# Patient Record
Sex: Female | Born: 1940 | Race: White | Hispanic: No | Marital: Single | Smoking: Former smoker
Health system: Southern US, Community
[De-identification: ages and names within clinical notes are randomized; demographics above are authoritative.]

## PROBLEM LIST (undated history)

## (undated) DIAGNOSIS — C539 Malignant neoplasm of cervix uteri, unspecified: Secondary | ICD-10-CM

## (undated) DIAGNOSIS — N189 Chronic kidney disease, unspecified: Secondary | ICD-10-CM

## (undated) DIAGNOSIS — I739 Peripheral vascular disease, unspecified: Secondary | ICD-10-CM

## (undated) DIAGNOSIS — C50919 Malignant neoplasm of unspecified site of unspecified female breast: Secondary | ICD-10-CM

## (undated) DIAGNOSIS — I10 Essential (primary) hypertension: Secondary | ICD-10-CM

## (undated) DIAGNOSIS — I701 Atherosclerosis of renal artery: Secondary | ICD-10-CM

## (undated) HISTORY — PX: REPAIR OF PERFORATED ULCER: SHX6065

## (undated) HISTORY — PX: EYE SURGERY: SHX253

## (undated) HISTORY — PX: THROMBOENDARTERECTOMY: SHX46

## (undated) HISTORY — PX: RENAL ARTERY BYPASS: SHX2318

---

## 2012-01-12 DIAGNOSIS — H353190 Nonexudative age-related macular degeneration, unspecified eye, stage unspecified: Secondary | ICD-10-CM | POA: Insufficient documentation

## 2012-01-12 DIAGNOSIS — I779 Disorder of arteries and arterioles, unspecified: Secondary | ICD-10-CM | POA: Insufficient documentation

## 2012-01-12 DIAGNOSIS — H348192 Central retinal vein occlusion, unspecified eye, stable: Secondary | ICD-10-CM | POA: Insufficient documentation

## 2012-01-12 DIAGNOSIS — H25819 Combined forms of age-related cataract, unspecified eye: Secondary | ICD-10-CM | POA: Insufficient documentation

## 2012-01-12 DIAGNOSIS — I10 Essential (primary) hypertension: Secondary | ICD-10-CM | POA: Insufficient documentation

## 2012-01-12 DIAGNOSIS — H401190 Primary open-angle glaucoma, unspecified eye, stage unspecified: Secondary | ICD-10-CM | POA: Insufficient documentation

## 2012-03-13 DIAGNOSIS — H269 Unspecified cataract: Secondary | ICD-10-CM | POA: Insufficient documentation

## 2012-07-03 DIAGNOSIS — Z87891 Personal history of nicotine dependence: Secondary | ICD-10-CM | POA: Insufficient documentation

## 2012-07-03 DIAGNOSIS — N289 Disorder of kidney and ureter, unspecified: Secondary | ICD-10-CM | POA: Insufficient documentation

## 2012-09-25 DIAGNOSIS — H35359 Cystoid macular degeneration, unspecified eye: Secondary | ICD-10-CM | POA: Insufficient documentation

## 2012-11-11 ENCOUNTER — Inpatient Hospital Stay (HOSPITAL_COMMUNITY)
Admission: EM | Admit: 2012-11-11 | Discharge: 2012-11-15 | DRG: 069 | Disposition: A | Discharge status: Home / Self Care | Payer: Medicare Other | Attending: Neurology | Admitting: Neurology

## 2012-11-11 ENCOUNTER — Encounter (HOSPITAL_COMMUNITY): Payer: Self-pay | Admitting: Emergency Medicine

## 2012-11-11 ENCOUNTER — Inpatient Hospital Stay (HOSPITAL_COMMUNITY): Payer: Medicare Other

## 2012-11-11 ENCOUNTER — Emergency Department (HOSPITAL_COMMUNITY): Payer: Medicare Other

## 2012-11-11 DIAGNOSIS — Z79899 Other long term (current) drug therapy: Secondary | ICD-10-CM

## 2012-11-11 DIAGNOSIS — Z853 Personal history of malignant neoplasm of breast: Secondary | ICD-10-CM

## 2012-11-11 DIAGNOSIS — N189 Chronic kidney disease, unspecified: Secondary | ICD-10-CM | POA: Diagnosis present

## 2012-11-11 DIAGNOSIS — G458 Other transient cerebral ischemic attacks and related syndromes: Principal | ICD-10-CM | POA: Diagnosis present

## 2012-11-11 DIAGNOSIS — I619 Nontraumatic intracerebral hemorrhage, unspecified: Secondary | ICD-10-CM

## 2012-11-11 DIAGNOSIS — I129 Hypertensive chronic kidney disease with stage 1 through stage 4 chronic kidney disease, or unspecified chronic kidney disease: Secondary | ICD-10-CM | POA: Diagnosis present

## 2012-11-11 DIAGNOSIS — I10 Essential (primary) hypertension: Secondary | ICD-10-CM

## 2012-11-11 DIAGNOSIS — E860 Dehydration: Secondary | ICD-10-CM | POA: Diagnosis present

## 2012-11-11 DIAGNOSIS — Z87891 Personal history of nicotine dependence: Secondary | ICD-10-CM

## 2012-11-11 DIAGNOSIS — Z8541 Personal history of malignant neoplasm of cervix uteri: Secondary | ICD-10-CM

## 2012-11-11 DIAGNOSIS — I480 Paroxysmal atrial fibrillation: Secondary | ICD-10-CM

## 2012-11-11 DIAGNOSIS — I779 Disorder of arteries and arterioles, unspecified: Secondary | ICD-10-CM

## 2012-11-11 DIAGNOSIS — Z7901 Long term (current) use of anticoagulants: Secondary | ICD-10-CM

## 2012-11-11 DIAGNOSIS — I639 Cerebral infarction, unspecified: Secondary | ICD-10-CM

## 2012-11-11 DIAGNOSIS — E785 Hyperlipidemia, unspecified: Secondary | ICD-10-CM

## 2012-11-11 DIAGNOSIS — I7771 Dissection of carotid artery: Secondary | ICD-10-CM | POA: Diagnosis present

## 2012-11-11 DIAGNOSIS — I6381 Other cerebral infarction due to occlusion or stenosis of small artery: Secondary | ICD-10-CM

## 2012-11-11 DIAGNOSIS — R29898 Other symptoms and signs involving the musculoskeletal system: Secondary | ICD-10-CM | POA: Diagnosis present

## 2012-11-11 DIAGNOSIS — N179 Acute kidney failure, unspecified: Secondary | ICD-10-CM | POA: Diagnosis present

## 2012-11-11 DIAGNOSIS — R55 Syncope and collapse: Secondary | ICD-10-CM

## 2012-11-11 DIAGNOSIS — I4891 Unspecified atrial fibrillation: Secondary | ICD-10-CM

## 2012-11-11 DIAGNOSIS — I739 Peripheral vascular disease, unspecified: Secondary | ICD-10-CM

## 2012-11-11 DIAGNOSIS — R4701 Aphasia: Secondary | ICD-10-CM | POA: Diagnosis present

## 2012-11-11 DIAGNOSIS — I614 Nontraumatic intracerebral hemorrhage in cerebellum: Secondary | ICD-10-CM

## 2012-11-11 HISTORY — DX: Chronic kidney disease, unspecified: N18.9

## 2012-11-11 HISTORY — DX: Peripheral vascular disease, unspecified: I73.9

## 2012-11-11 HISTORY — DX: Atherosclerosis of renal artery: I70.1

## 2012-11-11 HISTORY — DX: Malignant neoplasm of cervix uteri, unspecified: C53.9

## 2012-11-11 HISTORY — DX: Malignant neoplasm of unspecified site of unspecified female breast: C50.919

## 2012-11-11 HISTORY — DX: Essential (primary) hypertension: I10

## 2012-11-11 LAB — DIFFERENTIAL
Basophils Absolute: 0.1 10*3/uL (ref 0.0–0.1)
Basophils Relative: 1 % (ref 0–1)
Eosinophils Absolute: 0.2 10*3/uL (ref 0.0–0.7)
Lymphocytes Relative: 24 % (ref 12–46)
Monocytes Absolute: 0.7 10*3/uL (ref 0.1–1.0)
Neutro Abs: 3.9 10*3/uL (ref 1.7–7.7)

## 2012-11-11 LAB — CBC
HCT: 30.7 % — ABNORMAL LOW (ref 36.0–46.0)
MCV: 90 fL (ref 78.0–100.0)
Platelets: 177 10*3/uL (ref 150–400)
RBC: 3.41 MIL/uL — ABNORMAL LOW (ref 3.87–5.11)
WBC: 6.5 10*3/uL (ref 4.0–10.5)

## 2012-11-11 LAB — URINALYSIS, ROUTINE W REFLEX MICROSCOPIC
Bilirubin Urine: NEGATIVE
Hgb urine dipstick: NEGATIVE
Ketones, ur: NEGATIVE mg/dL
Nitrite: NEGATIVE
Protein, ur: 100 mg/dL — AB
Specific Gravity, Urine: 1.013 (ref 1.005–1.030)
Urobilinogen, UA: 0.2 mg/dL (ref 0.0–1.0)

## 2012-11-11 LAB — LIPID PANEL
LDL Cholesterol: 134 mg/dL — ABNORMAL HIGH (ref 0–99)
Triglycerides: 154 mg/dL — ABNORMAL HIGH (ref <150)
VLDL: 31 mg/dL (ref 0–40)

## 2012-11-11 LAB — COMPREHENSIVE METABOLIC PANEL
Albumin: 3.6 g/dL (ref 3.5–5.2)
BUN: 42 mg/dL — ABNORMAL HIGH (ref 6–23)
Calcium: 9 mg/dL (ref 8.4–10.5)
GFR calc Af Amer: 21 mL/min — ABNORMAL LOW (ref >90)
Glucose, Bld: 132 mg/dL — ABNORMAL HIGH (ref 70–99)
Sodium: 139 mEq/L (ref 135–145)
Total Protein: 6.8 g/dL (ref 6.0–8.3)

## 2012-11-11 LAB — URINE MICROSCOPIC-ADD ON

## 2012-11-11 LAB — TROPONIN I: Troponin I: <0.3 ng/mL (ref <0.30)

## 2012-11-11 LAB — POCT I-STAT TROPONIN I: Troponin i, poc: 0.09 ng/mL (ref 0.00–0.08)

## 2012-11-11 LAB — GLUCOSE, CAPILLARY: Glucose-Capillary: 124 mg/dL — ABNORMAL HIGH (ref 70–99)

## 2012-11-11 LAB — PROTIME-INR: Prothrombin Time: 14.1 seconds (ref 11.6–15.2)

## 2012-11-11 LAB — BASIC METABOLIC PANEL
BUN: 38 mg/dL — ABNORMAL HIGH (ref 6–23)
CO2: 20 mEq/L (ref 19–32)
Calcium: 8.9 mg/dL (ref 8.4–10.5)
Creatinine, Ser: 2.32 mg/dL — ABNORMAL HIGH (ref 0.50–1.10)
GFR calc non Af Amer: 20 mL/min — ABNORMAL LOW (ref >90)
Glucose, Bld: 116 mg/dL — ABNORMAL HIGH (ref 70–99)
Sodium: 139 mEq/L (ref 135–145)

## 2012-11-11 MED ORDER — TIMOLOL MALEATE 0.25 % OP SOLN
1.0000 [drp] | Freq: Two times a day (BID) | OPHTHALMIC | Status: DC
  Administered 2012-11-11 – 2012-11-15 (×9): 1 [drp] via OPHTHALMIC
  Filled 2012-11-11: qty 5

## 2012-11-11 MED ORDER — PREDNISOLONE ACETATE 1 % OP SUSP
1.0000 [drp] | Freq: Every day | OPHTHALMIC | Status: DC
  Administered 2012-11-11 – 2012-11-15 (×5): 1 [drp] via OPHTHALMIC
  Filled 2012-11-11: qty 1

## 2012-11-11 MED ORDER — ONDANSETRON HCL 4 MG/2ML IJ SOLN: 4.0000 mg | Freq: Four times a day (QID) | INTRAMUSCULAR | Status: DC | PRN

## 2012-11-11 MED ORDER — LABETALOL HCL 200 MG PO TABS
200.0000 mg | ORAL_TABLET | Freq: Two times a day (BID) | ORAL | Status: DC
  Administered 2012-11-11 – 2012-11-15 (×9): 200 mg via ORAL
  Filled 2012-11-11 (×10): qty 1

## 2012-11-11 MED ORDER — SENNOSIDES-DOCUSATE SODIUM 8.6-50 MG PO TABS
1.0000 | ORAL_TABLET | Freq: Two times a day (BID) | ORAL | Status: DC
  Administered 2012-11-11 – 2012-11-15 (×9): 1 via ORAL
  Filled 2012-11-11 (×9): qty 1

## 2012-11-11 MED ORDER — ACETAMINOPHEN 325 MG PO TABS
650.0000 mg | ORAL_TABLET | ORAL | Status: DC | PRN
  Administered 2012-11-11 – 2012-11-15 (×8): 650 mg via ORAL
  Filled 2012-11-11 (×8): qty 2

## 2012-11-11 MED ORDER — LABETALOL HCL 5 MG/ML IV SOLN
10.0000 mg | INTRAVENOUS | Status: DC | PRN
  Administered 2012-11-15: 20 mg via INTRAVENOUS
  Filled 2012-11-11 (×2): qty 4

## 2012-11-11 MED ORDER — KETOROLAC TROMETHAMINE 0.5 % OP SOLN
1.0000 [drp] | Freq: Every day | OPHTHALMIC | Status: DC
  Administered 2012-11-11 – 2012-11-15 (×5): 1 [drp] via OPHTHALMIC
  Filled 2012-11-11: qty 3

## 2012-11-11 MED ORDER — ALTEPLASE (STROKE) FULL DOSE INFUSION
0.9000 mg/kg | Freq: Once | INTRAVENOUS | Status: DC
  Filled 2012-11-11: qty 45

## 2012-11-11 MED ORDER — SALINE SPRAY 0.65 % NA SOLN
1.0000 | NASAL | Status: DC | PRN
  Administered 2012-11-11: 1 via NASAL
  Filled 2012-11-11: qty 44

## 2012-11-11 MED ORDER — CALCIUM CARBONATE ANTACID 500 MG PO CHEW
1.0000 | CHEWABLE_TABLET | Freq: Once | ORAL | Status: AC
  Administered 2012-11-11: 200 mg via ORAL
  Filled 2012-11-11: qty 1

## 2012-11-11 MED ORDER — OCUVITE-LUTEIN PO CAPS
1.0000 | ORAL_CAPSULE | Freq: Every day | ORAL | Status: DC
  Administered 2012-11-11 – 2012-11-15 (×4): 1 via ORAL
  Filled 2012-11-11 (×5): qty 1

## 2012-11-11 MED ORDER — ACETAMINOPHEN 650 MG RE SUPP: 650.0000 mg | RECTAL | Status: DC | PRN

## 2012-11-11 MED ORDER — LABETALOL HCL 5 MG/ML IV SOLN
5.0000 mg | Freq: Once | INTRAVENOUS | Status: AC
  Administered 2012-11-11: 5 mg via INTRAVENOUS

## 2012-11-11 MED ORDER — SODIUM CHLORIDE 0.9 % IV SOLN
INTRAVENOUS | Status: DC
  Administered 2012-11-11: 20:00:00 via INTRAVENOUS
  Administered 2012-11-11: 1000 mL via INTRAVENOUS
  Administered 2012-11-12 – 2012-11-15 (×4): via INTRAVENOUS

## 2012-11-11 MED ORDER — CLORAZEPATE DIPOTASSIUM 7.5 MG PO TABS: 7.5000 mg | ORAL_TABLET | Freq: Three times a day (TID) | ORAL | Status: DC

## 2012-11-11 MED ORDER — LABETALOL HCL 5 MG/ML IV SOLN
10.0000 mg | Freq: Once | INTRAVENOUS | Status: AC
  Administered 2012-11-11: 10 mg via INTRAVENOUS

## 2012-11-11 MED ORDER — PANTOPRAZOLE SODIUM 40 MG PO TBEC
40.0000 mg | DELAYED_RELEASE_TABLET | Freq: Every day | ORAL | Status: DC
  Administered 2012-11-11 – 2012-11-15 (×5): 40 mg via ORAL
  Filled 2012-11-11 (×5): qty 1

## 2012-11-11 MED ORDER — PANTOPRAZOLE SODIUM 40 MG IV SOLR
40.0000 mg | Freq: Every day | INTRAVENOUS | Status: DC
  Administered 2012-11-11: 40 mg via INTRAVENOUS
  Filled 2012-11-11 (×2): qty 40

## 2012-11-11 MED ORDER — LABETALOL HCL 5 MG/ML IV SOLN
5.0000 mg | Freq: Once | INTRAVENOUS | Status: AC
  Administered 2012-11-11: 5 mg via INTRAVENOUS
  Filled 2012-11-11: qty 4

## 2012-11-11 MED ORDER — NICARDIPINE HCL IN NACL 20-0.86 MG/200ML-% IV SOLN
5.0000 mg/h | INTRAVENOUS | Status: DC
  Administered 2012-11-11: 3 mg/h via INTRAVENOUS
  Administered 2012-11-11 (×2): 5 mg/h via INTRAVENOUS
  Administered 2012-11-11: 2 mg/h via INTRAVENOUS
  Administered 2012-11-12 (×2): 5 mg/h via INTRAVENOUS
  Administered 2012-11-12: 2.5 mg/h via INTRAVENOUS
  Administered 2012-11-12: 5 mg/h via INTRAVENOUS
  Administered 2012-11-13: 2.5 mg/h via INTRAVENOUS
  Filled 2012-11-11 (×9): qty 200

## 2012-11-11 MED ORDER — CALCIUM CARBONATE ANTACID 500 MG PO CHEW
1.0000 | CHEWABLE_TABLET | Freq: Two times a day (BID) | ORAL | Status: DC | PRN
  Filled 2012-11-11: qty 1

## 2012-11-11 MED ORDER — CLORAZEPATE DIPOTASSIUM 3.75 MG PO TABS
7.5000 mg | ORAL_TABLET | Freq: Three times a day (TID) | ORAL | Status: DC
  Administered 2012-11-11 – 2012-11-12 (×4): 7.5 mg via ORAL
  Filled 2012-11-11: qty 1
  Filled 2012-11-11: qty 2
  Filled 2012-11-11 (×3): qty 1
  Filled 2012-11-11 (×2): qty 2

## 2012-11-11 NOTE — Consult Note (Signed)
CARDIOLOGY CONSULT NOTE      Primary Care Physician: none Referring Physician:  Dr Pearlean Brownie  Admit Date: 11/11/2012  Reason for consultation:  Atrial  fibrillation  Dana Carr is a 72 y.o. female with a h/o significant peripheral vascular disease, poorly controlled hypertension, and atrial fibrillation who is admitted with an intercranial event.  She reports that her blood pressure has been very difficult to control recently.  She has also noted that several times when she checks her blood pressure cuff that it reads "afib".  She has occasional palpitations and heart beat irregularity but is mostly asymptomatic with afib. She is admitted after an event which occurred yesterday eventing.  She reports that around 11:15 PM that she walked into the kitchen, called out her daughter's name and then became unresponsive. When EMS arrival, she was moving her left side but not her right.  She was awake but nonverbal.  She has had gradual return of her neurologic function.  She was documented to have afib with elevated V rate (120s) by EMS, though she has been in sinus rhythm here.  CT of the brain reveals a small thalamic hemorrhage.  MRI is pending.  Today, she denies symptoms of chest pain, shortness of breath, orthopnea, PND, lower extremity edema, or dizziness. The patient is tolerating medications without difficulties and is otherwise without complaint today.   Past Medical History  Diagnosis Date  . Hypertension   . Cervical cancer   . Breast cancer   . Peripheral vascular disease   . Renal artery stenosis   . Chronic renal insufficiency    Past Surgical History  Procedure Laterality Date  . Repair of perforated ulcer N/A     ulceration * 2 of the esophagus  . Thromboendarterectomy    . 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 Eye surgery Right cataract repair    . clorazepate  7.5 mg Oral TID  . ketorolac  1 drop Right Eye Daily  . labetalol  200 mg Oral BID  .  pantoprazole  40 mg Oral Q1200  . prednisoLONE acetate  1 drop Right Eye Daily  . senna-docusate  1 tablet Oral BID  . timolol  1 drop Both Eyes BID   . sodium chloride 50 mL/hr at 11/11/12 1049  . niCARDipine 4 mg/hr (11/11/12 1137)    Allergies  Allergen Reactions  . Haldol (Haloperidol) Other (See Comments)    unknown  . Tape Other (See Comments)    Skin irritation  . Vasotec (Enalapril) Other (See Comments)    Throat irritation-lost voice    History   Social History  . Marital Status: Single    Spouse Name: N/A    Number of Children: N/A  . Years of Education: N/A   Occupational History  . Not on file.   Social History Main Topics  . Smoking status: Former Smoker -- 0.25 packs/day for 40 years    Types: Cigarettes    Quit date: 12/31/2011  . Smokeless tobacco: Never Used  . Alcohol Use: No  . Drug Use: No  . Sexually Active: Not Currently   Other Topics Concern  . Not on file   Social History Narrative   Lives in [place]    Family History  Problem Relation Age of Onset  . Atrial fibrillation Brother     ROS- All systems are reviewed and negative except as per the HPI above  Physical Exam: Telemetry: Filed Vitals:   11/11/12  1145 11/11/12 1200 11/11/12 1215 11/11/12 1230  BP: 158/82 137/99 141/74 125/79  Pulse: 90 90 85 84  Temp:   99.3 F (37.4 C)   TempSrc:   Oral   Resp: 20 19 20 22   Height:      Weight:      SpO2: 98% 98% 97% 98%    GEN- The patient is well appearing, alert and oriented x 3 today.   Head- normocephalic, atraumatic Eyes-  Sclera clear, conjunctiva pink Ears- hearing intact Oropharynx- clear Neck- supple, no JVP Lymph- no cervical lymphadenopathy Lungs- Clear to ausculation bilaterally, normal work of breathing Heart- Regular rate and rhythm, no murmurs, rubs or gallops, PMI not laterally displaced GI- soft, NT, ND, + BS Extremities- no clubbing, cyanosis, or edema MS- no significant deformity or  atrophy Skin- no rash or lesion Psych- euthymic mood, full affect Neuro- strength and sensation are intact  EKG: strip from ems reveals afib EKG 11/11/12- sinus rhythm, LAA, nonspecific ST/T changes\ Echo pending  Labs:   Lab Results  Component Value Date   WBC 6.5 11/11/2012   HGB 10.5* 11/11/2012   HCT 30.7* 11/11/2012   MCV 90.0 11/11/2012   PLT 177 11/11/2012    Recent Labs Lab 11/11/12 0047 11/11/12 0631  NA 139 139  K 4.3 4.0  CL 107 108  CO2 19 20  BUN 42* 38*  CREATININE 2.58* 2.32*  CALCIUM 9.0 8.9  PROT 6.8  --   BILITOT 0.4  --   ALKPHOS 75  --   ALT 9  --   AST 17  --   GLUCOSE 132* 116*   Lab Results  Component Value Date   TROPONINI <0.30 11/11/2012    Lab Results  Component Value Date   CHOL 213* 11/11/2012   Lab Results  Component Value Date   HDL 48 11/11/2012   Lab Results  Component Value Date   LDLCALC 134* 11/11/2012   Lab Results  Component Value Date   TRIG 154* 11/11/2012   Lab Results  Component Value Date   CHOLHDL 4.4 11/11/2012   No results found for this basename: LDLDIRECT      Radiology:  Head CT reviewed and discussed with Dr Pearlean Brownie  Echo: pending  ASSESSMENT AND PLAN:   1. Afib The patient has paroxysmal atrial fibrillation documented.  She has multiple CVA risk factors including age, female gender, hypertension, and PVD.  She should ideally be anticoagulated long term.  The question is whether she can be anticoagulated at this time.  She has prior gastric ulcer issues and now presents with possible ICH. I would not favor anticoagulation in the immediate future, but will defer to Dr Pearlean Brownie to see if she is an anticoagulation candidate 7 days or more from now.  Given recent ICH, I would favor a noval anticoagulant over coumadin due to the significant RRR in intracranial bleeding with novel anticoagulants.  Given this patients renal function and bleeding risks, I would favor eliquis, which appears to have to lowest bleeding  profile of the novel agents.  Given her Creatinine >1.5 and Wt<60 kg, she should be on eliquis 2.5mg  BID if it is started in the future. I have spoken at length with Dr Pearlean Brownie about this.  We will wait until the results of her brain MRI are available to better understand whether she has had recent CNS emboli or a primary hemorrhagic event. We will observe her rhythm on monitor and consider an antiarrhythmic medicine if afib recurs.  Echo and TFTs are ordered.  2. HTN She likely had a hypertensive crisis which lead to her hospitalization.  In the short term, I will defer BP control to the stroke service.  She will need close outpatient BP follow-up   Hillis Range, MD 11/11/2012  1:38 PM

## 2012-11-11 NOTE — H&P (Addendum)
CC: episode of unresponsiveness  History is obtained from:Daughter  HPI: Dana Carr is a 72 y.o. female a history of severe hypertension who earlier tonight around 11:15 PM walked into the kitchen, called out her daughter's name and then became unresponsive. When EMS arrival, she was moving her left side but not her right and was nonverbal. En route, she began having some speech and began moving her right side well. On arrival to the ER, she remained aphasic but was able   Per EMS, in the field they observed afib, but has been in sinus rhythm here.   LKW: 8:30pm tpa given: no, hemorrhage, rapidly improving symptoms.   ROS: Unable to assess secondary to patient's altered mental status.    PMH:  Hypertension Eye problems.   Family History: Unable to assess secondary to patient's altered mental status.    Social History: Tob: Unable to assess secondary to patient's altered mental status.   Exam: Current vital signs: BP 165/110  Pulse 87  Temp(Src) 97.6 F (36.4 C) (Oral)  Resp 21  Wt 50.349 kg (111 lb)  SpO2 100% Vital signs in last 24 hours: Temp:  [97.4 F (36.3 C)-97.6 F (36.4 C)] 97.6 F (36.4 C) (04/27 0128) Pulse Rate:  [87-103] 87 (04/27 0145) Resp:  [13-30] 21 (04/27 0145) BP: (159-183)/(110-132) 165/110 mmHg (04/27 0145) SpO2:  [100 %] 100 % (04/27 0145) FiO2 (%):  [3 %] 3 % (04/27 0109) Weight:  [50.349 kg (111 lb)] 50.349 kg (111 lb) (04/26 2300)  General: in bed, NAD CV: RRR Mental Status: Patient is awake, alert, on arrival, was able to answer some questions, but could not consistently to follow commands and made frequent paraphasic errors as well as having a significant expressive component to her aphasia.  Cranial Nerves: II: Blinks to threat bilateralyl. Pupils are equal, round, and reactive to light.   III,IV, VI: EOMI without ptosis or diploplia.  V: responds to stim on both cheeks VII: Facial movement is symmetric.  VIII: hearing is intact to  voice X: Uvula elevates symmetrically XI: Shoulder shrug is symmetric. XII: tongue is midline without atrophy or fasciculations.  Motor: Tone is normal. Bulk is normal. 5/5 strength was present in all four extremities.  Sensory: Sensation is symmetric to light touch and pin in the arms and legs. Cerebellar: Unable to comply with commands.  Gait: Not assessed due to acute nature of evaluation and multiple medical monitors in ED setting.  I have reviewed labs in epic and the results pertinent to this consultation are: Elevated creatinine  I have reviewed the images obtained:CT head - small hyperdensity in the left thalamus.   Impression: 72 yo F with transient right sided weakness and aphasia in the setting of what appears to be a recent thalamic hemorrhage by CT. Her improvement would argue against the hemorrhage being responsible for her symptoms and the possibility of new onset afib at onset of symptoms could raise the possibility that this was an ischemic TIA, but with the hemorrhage on CT, will treat this at this time.   Recommendations: 1) Admit to neuro ICU 2) Nicardipine for BP control 3) Hold all anticoagulants 4) SCDs for prophy 5) will continue home eye drops.  6) MRI, though htn likely etiology  This patient is critically ill and at significant risk of neurological worsening, death and care requires constant monitoring of vital signs, hemodynamics,respiratory and cardiac monitoring, neurological assessment, discussion with family, other specialists and medical decision making of high complexity. I spent 60  minutes of neurocritical care time  in the care of  this patient.  Ritta Slot, MD [facility] [phone]  If 7pm- 7am, please page neurology on call at [phone].  11/11/2012  2:23 AM

## 2012-11-11 NOTE — Progress Notes (Addendum)
Stroke Team Progress Note  HISTORY Dana Carr is a 72 y.o. female a history of severe hypertension who earlier tonight around 11:15 PM walked into the kitchen, called out her daughter's name and then became unresponsive. When EMS arrival, she was moving her left side but not her right and was nonverbal. En route, she began having some speech and began moving her right side well. On arrival to the ER, she remained aphasic but was able  Per EMS, in the field they observed afib, but has been in sinus rhythm here.  LKW: 8:30pm  tpa given: no, hemorrhage, rapidly improving symptoms.    Patient was not a TPA candidate secondary to Ct showing hemorrhage and rapid resolution . She was admitted to the neuro ICU for further evaluation and treatment.  SUBJECTIVE Her  sister is at the bedside.  Overall she feels her condition is completely resolved. She has remained in sinus rhythm overnight. Blood pressure has been controlled on Cardene drip. She has known history of atrial fibrillation but has not been able to tolerate aspirin in the past due to esophageal ulcers .  OBJECTIVE Most recent Vital Signs: Filed Vitals:   11/11/12 1015 11/11/12 1030 11/11/12 1045 11/11/12 1100  BP: 144/93 146/91 154/96 141/95  Pulse: 95 98 96 94  Temp:      TempSrc:      Resp: 25 18 23 17   Height:      Weight:      SpO2: 98% 97% 98% 99%   CBG (last 3)   Recent Labs  11/11/12 0053 11/11/12 0828  GLUCAP 123* 102*    IV Fluid Intake:   . sodium chloride 50 mL/hr at 11/11/12 1049  . niCARDipine 3 mg/hr (11/11/12 1053)    MEDICATIONS  . clorazepate  7.5 mg Oral TID  . ketorolac  1 drop Right Eye Daily  . labetalol  200 mg Oral BID  . pantoprazole (PROTONIX) IV  40 mg Intravenous QHS  . prednisoLONE acetate  1 drop Right Eye Daily  . senna-docusate  1 tablet Oral BID  . timolol  1 drop Both Eyes BID   PRN:  acetaminophen, acetaminophen, labetalol, ondansetron (ZOFRAN) IV, sodium chloride  Diet:  Cardiac    Activity:  Bedrest  DVT Prophylaxis:  SCDs CLINICALLY SIGNIFICANT STUDIES Basic Metabolic Panel:  Recent Labs Lab 11/11/12 0047 11/11/12 0631  NA 139 139  K 4.3 4.0  CL 107 108  CO2 19 20  GLUCOSE 132* 116*  BUN 42* 38*  CREATININE 2.58* 2.32*  CALCIUM 9.0 8.9   Liver Function Tests:  Recent Labs Lab 11/11/12 0047  AST 17  ALT 9  ALKPHOS 75  BILITOT 0.4  PROT 6.8  ALBUMIN 3.6   CBC:  Recent Labs Lab 11/11/12 0047  WBC 6.5  NEUTROABS 3.9  HGB 10.5*  HCT 30.7*  MCV 90.0  PLT 177   Coagulation:  Recent Labs Lab 11/11/12 0047  LABPROT 14.1  INR 1.10   Cardiac Enzymes:  Recent Labs Lab 11/11/12 0048  TROPONINI <0.30   Urinalysis:  Recent Labs Lab 11/11/12 0814  COLORURINE YELLOW  LABSPEC 1.013  PHURINE 5.5  GLUCOSEU NEGATIVE  HGBUR NEGATIVE  BILIRUBINUR NEGATIVE  KETONESUR NEGATIVE  PROTEINUR 100*  UROBILINOGEN 0.2  NITRITE NEGATIVE  LEUKOCYTESUR TRACE*   Lipid Panel No results found for this basename: chol, trig, hdl, cholhdl, vldl, ldlcalc   HgbA1C  No results found for this basename: HGBA1C    Urine Drug Screen:   No results  found for this basename: labopia, cocainscrnur, labbenz, amphetmu, thcu, labbarb    Alcohol Level: No results found for this basename: ETH,  in the last 168 hours  Ct Head Wo Contrast  11/11/2012  *RADIOLOGY REPORT*  Clinical Data: Code stroke, expressive aphasia, hypotension  CT HEAD WITHOUT CONTRAST  Technique:  Contiguous axial images were obtained from the base of the skull through the vertex without contrast.  Comparison: None.  Findings: There is focal hyperdensity in the right thalamus, image 16, measuring 4 mm.  Mild diffuse cortical volume loss noted with proportional ventricular prominence.  Periventricular white matter hypodensity is evident.  No midline shift.  No skull fracture. Orbits and paranasal sinuses are intact.  IMPRESSION: Focal hyperdensity within the right thalamus, less likely acute  hemorrhage.  Periventricular white matter hypodensity, which is age indeterminate but could be anoxic change related to hypotension given the provided clinical history.  Critical Value/emergent results were called by telephone at the time of interpretation on 11/11/2012 at 12:45 a.m. to Dr. Amada Jupiter, who verbally acknowledged these results.   Original Report Authenticated By: Christiana Pellant, M.D.     MRI of the brain  pending  MRA of the brain  pending  2D Echocardiogram  pending  Carotid Doppler  pending  CXR  pending  EKG   SINUS TACHYCARDIA ~ V-rate> 99 PROBABLE LEFT ATRIAL ABNORMALITY ~ P >58mS, <-0.28mV V1 BORDERLINE REPOLARIZATION ABNORMALITY ~ ST dep & abnormal Physical Exam : frail elderly caucasian lady not in distress. IMPRESSIONAwake alert. Afebrile. Head is nontraumatic. Neck is supple without bruit. Hearing is normal. Cardiac exam no murmur or gallop. Lungs are clear to auscultation. Distal pulses are well felt.  Neurological Exam :   Awake  Alert oriented x 3. Normal speech and language.eye movements full without nystagmus.fundi were not visualized. Vision acuity and fields appear normal. Hearing is normal. Palatal movements are normal. Face symmetric. Tongue midline. Normal strength, tone, reflexes and coordination. Normal sensation. Gait deferred. [name] is a 72 y.o. female presenting with syncopal episode and transient right sided weakness in setting of atrial fibrillation with rapid ventricular response. CT head shows a small right ventral thalamic hemorrhage which is likely clinically silent but may be a hemorrhagic infarct from atrial fibrillation..  On no prior to admission. Now on no for secondary stroke prevention. Patient with resultant  No residual deficits. Work up underway.   Accelerated Hypertension, Paroxysmal atrial fibrillation with rapid ventricular response  Hospital day # 0  TREATMENT/PLAN  Long discussion with patient and sister with  regards to atrial fibrillation and the risk of recurrent strokes and the role of aspirin and anticoagulation for secondary stroke prevention. She appears quite reluctant to take aspirin given her history of recent facial ulcers and hence may need one of the newer anticoagulants. The small thalamic hemorrhage seen on CT scan may in fact represent a hemorrhagic infarct and is relative exclusion for anticoagulation in the short-term but may not be so in the long-term.  Check MRI scan of the brain with MRA of the brain, transthoracic echo, carotid Dopplers, Lipid profile and  hemoglobin A1c.    Mobilize out of bed physical occupational therapy consults  Strict control of hypertension with blood pressure goal below 160 systolic. Resume labetalol   Consult cardiology for atrial fibrillation management.  This patient is critically ill and at significant risk of neurological worsening, death and care requires constant monitoring of vital signs, hemodynamics,respiratory and cardiac monitoring,review of multiple databases, neurological assessment, discussion with family, other specialists and medical decision making of high complexity. I spent 32 minutes of neurocritical care time  in the care of  this patient.   11/11/2012 11:16 AM   .

## 2012-11-11 NOTE — Progress Notes (Signed)
VASCULAR LAB PRELIMINARY  PRELIMINARY  PRELIMINARY  PRELIMINARY  Carotid duplex completed.    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 Bilateral vertebral artery flow is antegrade.  Dr. Pearlean Brownie notified of results  Toma Deiters, RVS 11/11/2012, 3:44 PM

## 2012-11-11 NOTE — ED Notes (Signed)
Recived the pt from CT with CODE STOKE. Pt with GCS 15 and hypertensive.

## 2012-11-11 NOTE — Code Documentation (Signed)
72 yo wf brought in via [facility] for sudden onset confusion & slurred speech.  Per EMS pt was found to be hypotensive & in A.fib HR 130s.  Pt with garbled speech & inconsistently f/c.  Code stroke called 0009, pt arrival 0026, EDP exam 0026, stroke team arrival 0009, LSN 2315, pt arrival in CT 0030, phlebotomist arrival 0040, CT read 0035, pharmacy notified to mix tPA 0032, tPA delivered to bedside 0044, ICU bed requested 0032.  Report called by Radiologist 0045. tPA not given due to small hemorrhage on CT scan.  Pt NIH 3 for speech deficits.  Pt's s/s improving.

## 2012-11-11 NOTE — Progress Notes (Signed)
Utilization review completed.  

## 2012-11-12 DIAGNOSIS — I619 Nontraumatic intracerebral hemorrhage, unspecified: Secondary | ICD-10-CM

## 2012-11-12 DIAGNOSIS — R55 Syncope and collapse: Secondary | ICD-10-CM

## 2012-11-12 LAB — GLUCOSE, CAPILLARY
Glucose-Capillary: 118 mg/dL — ABNORMAL HIGH (ref 70–99)
Glucose-Capillary: 115 mg/dL — ABNORMAL HIGH (ref 70–99)

## 2012-11-12 LAB — HEMOGLOBIN A1C: Mean Plasma Glucose: 117 mg/dL — ABNORMAL HIGH (ref <117)

## 2012-11-12 LAB — URINE CULTURE

## 2012-11-12 LAB — BASIC METABOLIC PANEL
BUN: 25 mg/dL — ABNORMAL HIGH (ref 6–23)
Chloride: 110 mEq/L (ref 96–112)
Creatinine, Ser: 1.82 mg/dL — ABNORMAL HIGH (ref 0.50–1.10)
GFR calc Af Amer: 31 mL/min — ABNORMAL LOW (ref >90)
Glucose, Bld: 103 mg/dL — ABNORMAL HIGH (ref 70–99)

## 2012-11-12 MED ORDER — TRAMADOL HCL 50 MG PO TABS
100.0000 mg | ORAL_TABLET | Freq: Two times a day (BID) | ORAL | Status: DC | PRN
  Filled 2012-11-12: qty 2

## 2012-11-12 MED ORDER — CALCIUM CARBONATE ANTACID 500 MG PO CHEW
400.0000 mg | CHEWABLE_TABLET | Freq: Four times a day (QID) | ORAL | Status: DC | PRN
  Administered 2012-11-12 (×2): 400 mg via ORAL
  Filled 2012-11-12 (×2): qty 2

## 2012-11-12 MED ORDER — APIXABAN 5 MG PO TABS: 5.0000 mg | ORAL_TABLET | Freq: Two times a day (BID) | ORAL | Status: DC

## 2012-11-12 MED ORDER — APIXABAN 2.5 MG PO TABS
2.5000 mg | ORAL_TABLET | Freq: Two times a day (BID) | ORAL | Status: DC
  Administered 2012-11-12 – 2012-11-14 (×5): 2.5 mg via ORAL
  Filled 2012-11-12 (×7): qty 1

## 2012-11-12 NOTE — Progress Notes (Signed)
SUBJECTIVE: The patient is doing well today.  At this time, she denies chest pain, shortness of breath, or any new concerns.  . clorazepate  7.5 mg Oral TID  . ketorolac  1 drop Right Eye Daily  . labetalol  200 mg Oral BID  . multivitamin-lutein  1 capsule Oral Daily  . pantoprazole  40 mg Oral Q1200  . prednisoLONE acetate  1 drop Right Eye Daily  . senna-docusate  1 tablet Oral BID  . timolol  1 drop Both Eyes BID   . sodium chloride 50 mL/hr at 11/11/12 1933  . niCARDipine 5 mg/hr (11/12/12 0422)    OBJECTIVE: Physical Exam: Filed Vitals:   11/12/12 0630 11/12/12 0645 11/12/12 0700 11/12/12 0800  BP: 154/91 149/90 135/86 136/84  Pulse: 86 91 92 88  Temp:      TempSrc:      Resp: 17 17 17 19   Height:      Weight:      SpO2: 96% 96% 98% 98%    Intake/Output Summary (Last 24 hours) at 11/12/12 0804 Last data filed at 11/12/12 0700  Gross per 24 hour  Intake 2945.66 ml  Output   2600 ml  Net 345.66 ml    Telemetry reveals sinus rhythm, no afib  GEN- The patient is well appearing, alert and oriented x 3 today.   Head- normocephalic, atraumatic Eyes-  Sclera clear, conjunctiva pink Ears- hearing intact Oropharynx- clear Neck- supple  Lungs- Clear to ausculation bilaterally, normal work of breathing Heart- Regular rate and rhythm  GI- soft, NT, ND, + BS Extremities- no clubbing, cyanosis, or edema Skin- no rash or lesion  LABS: Basic Metabolic Panel:  Recent Labs  16/10/96 0631 11/12/12 0504  NA 139 141  K 4.0 3.2*  CL 108 110  CO2 20 19  GLUCOSE 116* 103*  BUN 38* 25*  CREATININE 2.32* 1.82*  CALCIUM 8.9 9.3   Liver Function Tests:  Recent Labs  11/11/12 0047  AST 17  ALT 9  ALKPHOS 75  BILITOT 0.4  PROT 6.8  ALBUMIN 3.6   No results found for this basename: LIPASE, AMYLASE,  in the last 72 hours CBC:  Recent Labs  11/11/12 0047  WBC 6.5  NEUTROABS 3.9  HGB 10.5*  HCT 30.7*  MCV 90.0  PLT 177   Cardiac Enzymes:  Recent  Labs  11/11/12 0048  TROPONINI <0.30   BNP: No components found with this basename: POCBNP,  D-Dimer: No results found for this basename: DDIMER,  in the last 72 hours Hemoglobin A1C:  Recent Labs  11/11/12 0047  HGBA1C 5.7*   Fasting Lipid Panel:  Recent Labs  11/11/12 0631  CHOL 213*  HDL 48  LDLCALC 134*  TRIG 154*  CHOLHDL 4.4   Thyroid Function Tests: No results found for this basename: TSH, T4TOTAL, FREET3, T3FREE, THYROIDAB,  in the last 72 hours Anemia Panel: No results found for this basename: VITAMINB12, FOLATE, FERRITIN, TIBC, IRON, RETICCTPCT,  in the last 72 hours  RADIOLOGY: Dg Chest 1 View  11/11/2012  *RADIOLOGY REPORT*  Clinical Data: Stroke  CHEST - 1 VIEW  Comparison: None.  Findings: The cardiac silhouette is mildly enlarged.  The aorta is tortuous.  No mediastinal or hilar masses.  The lungs are clear. The bony thorax is diffusely demineralized but intact.  IMPRESSION: No acute cardiopulmonary disease.   Original Report Authenticated By: Amie Portland, M.D.    Ct Head Wo Contrast  11/11/2012  *RADIOLOGY REPORT*  Clinical  Data: Code stroke, expressive aphasia, hypotension  CT HEAD WITHOUT CONTRAST  Technique:  Contiguous axial images were obtained from the base of the skull through the vertex without contrast.  Comparison: None.  Findings: There is focal hyperdensity in the right thalamus, image 16, measuring 4 mm.  Mild diffuse cortical volume loss noted with proportional ventricular prominence.  Periventricular white matter hypodensity is evident.  No midline shift.  No skull fracture. Orbits and paranasal sinuses are intact.  IMPRESSION: Focal hyperdensity within the right thalamus, less likely acute hemorrhage.  Periventricular white matter hypodensity, which is age indeterminate but could be anoxic change related to hypotension given the provided clinical history.  Critical Value/emergent results were called by telephone at the time of interpretation on  11/11/2012 at 12:45 a.m. to Dr. Amada Jupiter, who verbally acknowledged these results.   Original Report Authenticated By: Christiana Pellant, M.D.    Mr [facility] Wo Contrast  11/11/2012  *RADIOLOGY REPORT*  Clinical Data:  Code stroke.  Expressive aphasia.  T-PA given.  MRI HEAD WITHOUT CONTRAST MRA HEAD WITHOUT CONTRAST  Technique:  Multiplanar, multiecho pulse sequences of the brain and surrounding structures were obtained without intravenous contrast. Angiographic images of the head were obtained using MRA technique without contrast.  Comparison:  CT head 11/11/2012  MRI HEAD  Findings:  Negative for acute ischemic infarct.  Diffusion weighted imaging shows no area of restricted diffusion.  Small area of susceptibility in the right thalamus.  This was hyperdense on CT.  Based on the MRI, I would favor this is an area of chronic hemorrhage with calcification and iron deposition. Recent hemorrhage not excluded.  There is a small area of chronic hemorrhage in the right ventral pons.  There is moderate to advanced chronic microvascular ischemia in the white matter and brainstem.  No cortical infarct.  Negative for mass lesion.  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  IMPRESSION: Negative for acute ischemic infarct.  Moderate to advanced chronic microvascular ischemia.  Small area of susceptibility in the right pons, favor chronic hemorrhage.  This is hyperdense on CT and recent hemorrhage cannot be completely excluded.  There is also a smaller chronic hemorrhage in the right pons.  MRA HEAD  Findings: Both vertebral arteries are tortuous but patent to the basilar.  The basilar is mildly enlarged but widely patent. Superior cerebellar and posterior cerebral arteries are patent bilaterally.  Right internal carotid artery is tortuous and widely patent.  Right anterior and middle cerebral arteries are widely patent.  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  There is flow in the left anterior and middle cerebral  arteries which are supplied via of the anterior communicating  artery and the left posterior communicating artery.  Both posterior communicating arteries are patent.  Negative for aneurysm.  IMPRESSION: Occluded left internal carotid artery.  There is good collateral circulation in the circle of Willis with patent anterior middle cerebral arteries on the left.  Tortuous intracranial vessels suggesting chronic hypertension.   Original Report Authenticated By: Janeece Riggers, M.D.    Mr Brain Wo Contrast  11/11/2012  *RADIOLOGY REPORT*  Clinical Data:  Code stroke.  Expressive aphasia.  T-PA given.  MRI HEAD WITHOUT CONTRAST MRA HEAD WITHOUT CONTRAST  Technique:  Multiplanar, multiecho pulse sequences of the brain and surrounding structures were obtained without intravenous contrast. Angiographic images of the head were obtained using MRA technique without contrast.  Comparison:  CT head 11/11/2012  MRI HEAD  Findings:  Negative for acute ischemic infarct.  Diffusion weighted imaging shows no area of restricted diffusion.  Small area of susceptibility in the right thalamus.  This was hyperdense on CT.  Based on the MRI, I would favor this is an area of chronic hemorrhage with calcification and iron deposition. Recent hemorrhage not excluded.  There is a small area of chronic hemorrhage in the right ventral pons.  There is moderate to advanced chronic microvascular ischemia in the white matter and brainstem.  No cortical infarct.  Negative for mass lesion.  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  IMPRESSION: Negative for acute ischemic infarct.  Moderate to advanced chronic microvascular ischemia.  Small area of susceptibility in the right pons, favor chronic hemorrhage.  This is hyperdense on CT and recent hemorrhage cannot be completely excluded.  There is also a smaller chronic hemorrhage in the right pons.  MRA HEAD  Findings: Both vertebral arteries are tortuous but patent to the basilar.  The basilar is mildly  enlarged but widely patent. Superior cerebellar and posterior cerebral arteries are patent bilaterally.  Right internal carotid artery is tortuous and widely patent.  Right anterior and middle cerebral arteries are widely patent.  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  There is flow in the left anterior and middle cerebral arteries which are supplied via of the anterior communicating  artery and the left posterior communicating artery.  Both posterior communicating arteries are patent.  Negative for aneurysm.  IMPRESSION: Occluded left internal carotid artery.  There is good collateral circulation in the circle of Willis with patent anterior middle cerebral arteries on the left.  Tortuous intracranial vessels suggesting chronic hypertension.   Original Report Authenticated By: Janeece Riggers, M.D.     ASSESSMENT AND PLAN:  Active Problems:   ICH (intracerebral hemorrhage)   Accelerated hypertension  1. Afib  The patient has paroxysmal atrial fibrillation documented. She has multiple CVA risk factors including age, female gender, hypertension, and PVD. She should ideally be anticoagulated long term.  Dr Pearlean Brownie will review the MRI this am and make decisions about whether she is an acceptable candidate.  Given chronic ICH, I will defer this decision to him.   If the decision is made for anticoagulation, I would recommend eliquis given lower bleeding profile.  If Creatinine remains >1.5, with Wt<60 kg, she should be on eliquis 2.5mg  BID if it is started in the future.  If creatinine improves to <1.5 then her dose would be 5mg  BID.   Echo and TFTs are pending. Once able from a neuro standpoint, titrate labetalol for BP and heart rate control of afib.    2. HTN  She likely had a hypertensive crisis which lead to her hospitalization. In the short term, I will defer BP control to the stroke service. Titrate labetalol as above.  She will need close outpatient BP follow-up   Hillis Range,  MD 11/12/2012 8:04 AM

## 2012-11-12 NOTE — Progress Notes (Signed)
  Echocardiogram 2D Echocardiogram has been performed.  Dana Carr 11/12/2012, 1:58 PM

## 2012-11-12 NOTE — Evaluation (Signed)
Occupational Therapy Evaluation Patient Details Name: Dana Carr MRN: [id number] DOB: Jul 26, 1940 Today's Date: 11/12/2012 Time: 1027-2536 OT Time Calculation (min): 48 min  OT Assessment / Plan / Recommendation Clinical Impression  Pt admitted with confusion, slurred speech, and R side weakness which resolved with afib and RVR.  Pt presents with mild impaired balance.  She also has baseline impaired vision which may contribute.  Recommended pt consider tub seat and grab bar for safety at home.  Will be returning home with sister.    OT Assessment  Patient does not need any further OT services    Follow Up Recommendations  No OT follow up;Supervision - Intermittent    Barriers to Discharge      Equipment Recommendations       Recommendations for Other Services    Frequency       Precautions / Restrictions Precautions Precautions: Fall   Pertinent Vitals/Pain No pain, on room air    ADL  Eating/Feeding: Independent Where Assessed - Eating/Feeding: Chair Grooming: Wash/dry hands;Teeth care;Min guard Where Assessed - Grooming: Unsupported standing Upper Body Bathing: Set up Where Assessed - Upper Body Bathing: Unsupported sitting Lower Body Bathing: Min guard Where Assessed - Lower Body Bathing: Unsupported sitting;Supported sit to stand Upper Body Dressing: Set up Where Assessed - Upper Body Dressing: Unsupported sitting Lower Body Dressing: Min guard Where Assessed - Lower Body Dressing: Unsupported sitting;Supported sit to stand Toilet Transfer: Min Pension scheme manager Method: Sit to Barista: Regular height toilet Toileting - Clothing Manipulation and Hygiene: Minimal assistance Where Assessed - Engineer, mining and Hygiene: Sit to stand from 3-in-1 or toilet Equipment Used: Gait belt Transfers/Ambulation Related to ADLs: min guard assist, progressed to supervision without device ADL Comments: Pt impeded by mild impaired balance.     OT Diagnosis:    OT Problem List:   OT Treatment Interventions:     OT Goals    Visit Information  Last OT Received On: 11/12/12 Assistance Needed: +1 PT/OT Co-Evaluation/Treatment: Yes    Subjective Data  Subjective: "That medicine made my legs weak." Patient Stated Goal: Home with sister.   Prior Functioning     Home Living Lives With: Son Available Help at Discharge: Family Type of Home: House Home Access: Stairs to enter Secretary/administrator of Steps: 8 Entrance Stairs-Rails: Right Home Layout: Two level;Bed/bath upstairs Alternate Level Stairs-Number of Steps: 15 Alternate Level Stairs-Rails: Right Bathroom Shower/Tub: Tub/shower unit (pt has the option of walk in shower) Bathroom Toilet: Standard Home Adaptive Equipment: Bedside commode/3-in-1;Walker - rolling Prior Function Level of Independence: Independent Able to Take Stairs?: Yes Driving: No Comments: pt performs ADL very slowly at baseline Communication Communication: No difficulties Dominant Hand: Right         Vision/Perception Vision - History Visual History: Glaucoma;Cataracts;Macular degeneration Patient Visual Report: No change from baseline Vision - Assessment Additional Comments: pt cannot see small print, needs high contrast and lighting   Cognition  Cognition Arousal/Alertness: Awake/alert Behavior During Therapy: WFL for tasks assessed/performed Overall Cognitive Status: Within Functional Limits for tasks assessed    Extremity/Trunk Assessment Right Upper Extremity Assessment RUE ROM/Strength/Tone: WFL for tasks assessed RUE Coordination: WFL - gross/fine motor Left Upper Extremity Assessment LUE ROM/Strength/Tone: WFL for tasks assessed Trunk Assessment Trunk Assessment: Normal     Mobility Bed Mobility Bed Mobility: Supine to Sit;Sitting - Scoot to Edge of Bed Supine to Sit: 6: Modified independent (Device/Increase time);HOB elevated Sitting - Scoot to Edge of Bed:  6: Modified independent (Device/Increase  time) Transfers Transfers: Sit to Stand;Stand to Sit Sit to Stand: 4: Min guard;From bed;From toilet;With upper extremity assist Stand to Sit: 4: Min guard;With upper extremity assist;To chair/3-in-1;To toilet Details for Transfer Assistance: one LOB with standing initially     Exercise     Balance Balance Balance Assessed: Yes Static Standing Balance Static Standing - Balance Support: No upper extremity supported Static Standing - Level of Assistance: 5: Stand by assistance Static Standing - Comment/# of Minutes: 5   End of Session OT - End of Session Equipment Utilized During Treatment: Gait belt Activity Tolerance: Patient tolerated treatment well Patient left: in chair;with call bell/phone within reach;with family/visitor present  GO     Evern Bio 11/12/2012, 1:17 PM [phone]

## 2012-11-12 NOTE — Progress Notes (Signed)
Pt. Is on clorazepate and dosage was given this morning at 1000. Afterwards the patient was very sleepy but would arouse to her name. At 1600 the patient was attempting to get OOB. I helped her to the bathroom but patient memory was in question. She was oriented x 4 but she couldn't remember why she was up sitting on the [facility]. A female nurse entered the room and she called him "doug." NP was called and made aware. Clorazepate was d/c and will continue to monitor.

## 2012-11-12 NOTE — Evaluation (Signed)
Physical Therapy Evaluation Patient Details Name: Dana Carr MRN: [id number] DOB: 1940-07-27 Today's Date: 11/12/2012 Time: 4098-1191 PT Time Calculation (min): 48 min  PT Assessment / Plan / Recommendation Clinical Impression  Patient is a 72 y/o female admitted with slurred speech, confusion and right side weakness that was transient.  She also had A-Fib with RVR.  She has history of limited vision due to macular degeneration and was staying at sister's home prior to admission.  Feel safe for d/c home with assist of sister and follow up HHPT for strengthening and safety.  No current DME needs.    PT Assessment  Patient needs continued PT services    Follow Up Recommendations  Home health PT;Supervision/Assistance - 24 hour    Does the patient have the potential to tolerate intense rehabilitation      Barriers to Discharge        Equipment Recommendations  None recommended by PT    Recommendations for Other Services     Frequency Min 3X/week    Precautions / Restrictions Precautions Precautions: Fall   Pertinent Vitals/Pain 8/10 headache      Mobility  Bed Mobility Bed Mobility: Supine to Sit;Sitting - Scoot to Edge of Bed Supine to Sit: 6: Modified independent (Device/Increase time);HOB elevated Sitting - Scoot to Edge of Bed: 6: Modified independent (Device/Increase time) Transfers Sit to Stand: 4: Min guard;From bed;From toilet;With upper extremity assist Stand to Sit: 4: Min guard;With upper extremity assist;To chair/3-in-1;To toilet Details for Transfer Assistance: one LOB with standing initially Ambulation/Gait Ambulation/Gait Assistance: 4: Min guard;5: Supervision Ambulation Distance (Feet): 150 Feet Assistive device: None Ambulation/Gait Assistance Details: initially unsteady and needing minguard for stability and due to visual deficits; as progressed able to be supervision in room due to improved stability. Gait Pattern: Step-through pattern;Decreased stride  length;Wide base of support Stairs: Yes Stairs Assistance: 4: Min guard (admits to difficulty seeing edge of steps) Stair Management Technique: One rail Right;Alternating pattern;Forwards Number of Stairs: 4        PT Diagnosis: Abnormality of gait  PT Problem List: Decreased balance;Decreased mobility PT Treatment Interventions: DME instruction;Gait training;Stair training;Balance training;Functional mobility training;Patient/family education;Therapeutic activities;Therapeutic exercise   PT Goals Acute Rehab PT Goals PT Goal Formulation: With patient/family Time For Goal Achievement: 11/26/12 Potential to Achieve Goals: Good Pt will go Sit to Stand: with modified independence PT Goal: Sit to Stand - Progress: Goal set today Pt will go Stand to Sit: with modified independence PT Goal: Stand to Sit - Progress: Goal set today Pt will Stand: Independently;6 - 10 min;with no upper extremity support PT Goal: Stand - Progress: Goal set today Pt will Ambulate: >150 feet;with least restrictive assistive device;with modified independence PT Goal: Ambulate - Progress: Goal set today Pt will Perform Home Exercise Program: with supervision, verbal cues required/provided PT Goal: Perform Home Exercise Program - Progress: Goal set today  Visit Information  Last PT Received On: 11/12/12 Assistance Needed: +1 PT/OT Co-Evaluation/Treatment: Yes    Subjective Data  Subjective: I can get around okay Patient Stated Goal: To return to independent   Prior Functioning  Home Living Lives With: Son (but currently staying locally with sisters) Available Help at Discharge: Family Type of Home: House Home Access: Stairs to enter Secretary/administrator of Steps: 8 Entrance Stairs-Rails: Right Home Layout: Two level;Bed/bath upstairs Alternate Level Stairs-Number of Steps: 15 Alternate Level Stairs-Rails: Right Bathroom Shower/Tub: Tub/shower unit (pt has the option of walk in shower) Bathroom  Toilet: Standard Home Adaptive Equipment: Bedside commode/3-in-1;Walker -  rolling Prior Function Level of Independence: Independent Able to Take Stairs?: Yes Driving: No Comments: pt performs ADL very slowly at baseline Communication Communication: No difficulties Dominant Hand: Right    Cognition  Cognition Arousal/Alertness: Awake/alert Behavior During Therapy: WFL for tasks assessed/performed Overall Cognitive Status: Within Functional Limits for tasks assessed    Extremity/Trunk Assessment Right Upper Extremity Assessment RUE ROM/Strength/Tone: WFL for tasks assessed RUE Coordination: WFL - gross/fine motor Left Upper Extremity Assessment LUE ROM/Strength/Tone: WFL for tasks assessed Right Lower Extremity Assessment RLE ROM/Strength/Tone: WFL for tasks assessed RLE Sensation: WFL - Light Touch Left Lower Extremity Assessment LLE ROM/Strength/Tone: WFL for tasks assessed LLE Sensation: WFL - Light Touch Trunk Assessment Trunk Assessment: Normal   Balance Balance Balance Assessed: Yes Static Standing Balance Static Standing - Balance Support: No upper extremity supported Static Standing - Level of Assistance: 5: Stand by assistance Static Standing - Comment/# of Minutes: 5  End of Session PT - End of Session Equipment Utilized During Treatment: Gait belt Activity Tolerance: Patient tolerated treatment well Patient left: in chair;with call bell/phone within reach;with family/visitor present  GP     [facility] 11/12/2012, 1:58 PM

## 2012-11-12 NOTE — Progress Notes (Addendum)
Stroke Team Progress Note  HISTORY Dana Carr is a 72 y.o. female a history of severe hypertension who earlier tonight around 11:15 PM walked into the kitchen, called out her daughter's name and then became unresponsive. When EMS arrival, she was moving her left side but not her right and was nonverbal. En route, she began having some speech and began moving her right side well. On arrival to the ER, she remained aphasic but was able  Per EMS, in the field they observed afib, but has been in sinus rhythm here.  LKW: 8:30pm  tpa given: no, hemorrhage, rapidly improving symptoms.    Patient was not a TPA candidate secondary to Ct showing hemorrhage and rapid resolution . She was admitted to the neuro ICU for further evaluation and treatment.  SUBJECTIVE Her  sister is at the bedside.  Overall she feels her condition is completely resolved. She has remained in sinus rhythm overnight. Blood pressure has been controlled on Cardene drip. She has known history of atrial fibrillation but has not been able to tolerate aspirin in the past due to esophageal ulcers .  OBJECTIVE Most recent Vital Signs: Filed Vitals:   11/12/12 0630 11/12/12 0645 11/12/12 0700 11/12/12 0800  BP: 154/91 149/90 135/86 136/84  Pulse: 86 91 92 88  Temp:      TempSrc:      Resp: 17 17 17 19   Height:      Weight:      SpO2: 96% 96% 98% 98%   CBG (last 3)   Recent Labs  11/11/12 1723 11/11/12 2128 11/12/12 0755  GLUCAP 112* 132* 119*    IV Fluid Intake:   . sodium chloride 50 mL/hr at 11/12/12 0800  . niCARDipine 2.5 mg/hr (11/12/12 0856)    MEDICATIONS  . clorazepate  7.5 mg Oral TID  . ketorolac  1 drop Right Eye Daily  . labetalol  200 mg Oral BID  . multivitamin-lutein  1 capsule Oral Daily  . pantoprazole  40 mg Oral Q1200  . prednisoLONE acetate  1 drop Right Eye Daily  . senna-docusate  1 tablet Oral BID  . timolol  1 drop Both Eyes BID   PRN:  acetaminophen, acetaminophen, labetalol, ondansetron  (ZOFRAN) IV, sodium chloride  Diet:  Cardiac   Activity:  Bedrest  DVT Prophylaxis:  SCDs CLINICALLY SIGNIFICANT STUDIES Basic Metabolic Panel:   Recent Labs Lab 11/11/12 0631 11/12/12 0504  NA 139 141  K 4.0 3.2*  CL 108 110  CO2 20 19  GLUCOSE 116* 103*  BUN 38* 25*  CREATININE 2.32* 1.82*  CALCIUM 8.9 9.3   Liver Function Tests:   Recent Labs Lab 11/11/12 0047  AST 17  ALT 9  ALKPHOS 75  BILITOT 0.4  PROT 6.8  ALBUMIN 3.6   CBC:   Recent Labs Lab 11/11/12 0047  WBC 6.5  NEUTROABS 3.9  HGB 10.5*  HCT 30.7*  MCV 90.0  PLT 177   Coagulation:   Recent Labs Lab 11/11/12 0047  LABPROT 14.1  INR 1.10   Cardiac Enzymes:   Recent Labs Lab 11/11/12 0048  TROPONINI <0.30   Urinalysis:   Recent Labs Lab 11/11/12 0814  COLORURINE YELLOW  LABSPEC 1.013  PHURINE 5.5  GLUCOSEU NEGATIVE  HGBUR NEGATIVE  BILIRUBINUR NEGATIVE  KETONESUR NEGATIVE  PROTEINUR 100*  UROBILINOGEN 0.2  NITRITE NEGATIVE  LEUKOCYTESUR TRACE*   Lipid Panel     Component Value Date/Time   CHOL 213* 11/11/2012 0631   TRIG 154* 11/11/2012  0631   HDL 48 11/11/2012 0631   CHOLHDL 4.4 11/11/2012 0631   VLDL 31 11/11/2012 0631   LDLCALC 134* 11/11/2012 0631   HgbA1C  Lab Results  Component Value Date   HGBA1C 5.7* 11/11/2012    Urine Drug Screen:   No results found for this basename: labopia,  cocainscrnur,  labbenz,  amphetmu,  thcu,  labbarb    Alcohol Level: No results found for this basename: ETH,  in the last 168 hours  CT Head 11/11/2012  Focal hyperdensity within the right thalamus, less likely acute hemorrhage.  Periventricular white matter hypodensity, which is age indeterminate but could be anoxic change related to hypotension given the provided clinical history.    MRI of the brain  11/11/2012  Negative for acute ischemic infarct.  Moderate to advanced chronic microvascular ischemia.  Small area of susceptibility in the right pons, favor chronic hemorrhage.  This  is hyperdense on CT and recent hemorrhage cannot be completely excluded.  There is also a smaller chronic hemorrhage in the right pons.    MRA of the brain  11/11/2012  Occluded left internal carotid artery.  There is good collateral circulation in the circle of Willis with patent anterior middle cerebral arteries on the left.  Tortuous intracranial vessels suggesting chronic hypertension.    2D Echocardiogram    Carotid Doppler  Right There appears to be a dissection in the most distal CCA / Bulb region. Left - ICA is occluded. Bilateral vertebral artery flow is antegrade.   CXR 11/11/2012   No acute cardiopulmonary disease.      EKG   SINUS TACHYCARDIA   IMPRESSION [name] is a 72 y.o. female presenting with syncopal episode and transient right sided weakness in setting of atrial fibrillation with rapid ventricular response. CT head and MRI head confirms a small right thalamic and pontine hemorrhage which is clinically silent and represents remote microhemorrhage from chronic uncontrolled hypertensive microvascular disease .  On no  Antiplatelets prior to admission. Patient with no resultant neuro deficits. Work up underway.   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 Will defer to cardiolgy (patient has had reactions to clonidine and tenormin in the past)  Mobilize out of bed physical occupational therapy consults                                                        Dr. Pearlean Brownie d/w Dr Johney Frame agree with low dose Eliquis for stroke prevention given recent TIA and atrial  fibrillation  Annie Main, MSN, RN, ANVP-BC, ANP-BC, GNP-BC [facility] Pager: [phone] 11/12/2012 9:29 AM  I have personally obtained a history, examined the patient, evaluated imaging results, and formulated the assessment and plan of care. I agree with the above.  Delia Heady, MD

## 2012-11-12 NOTE — Evaluation (Signed)
Speech Language Pathology Evaluation Patient Details Name: Dana Carr MRN: [id number] DOB: 12-07-1940 Today's Date: 11/12/2012 Time: 4098-1191 SLP Time Calculation (min): 20 min  Problem List:  Patient Active Problem List  Diagnosis  . ICH (intracerebral hemorrhage)  . Accelerated hypertension   Past Medical History:  Past Medical History  Diagnosis Date  . Hypertension   . Cervical cancer   . Breast cancer   . Peripheral vascular disease   . Renal artery stenosis   . Chronic renal insufficiency    Past Surgical History:  Past Surgical History  Procedure Laterality Date  . Repair of perforated ulcer N/A     ulceration * 2 of the esophagus  . Thromboendarterectomy    . 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 Eye surgery Right cataract repair   HPI:  Dana Carr is a 72 y.o. female presenting with syncopal episode and transient right sided weakness in setting of atrial fibrillation with rapid ventricular response. CT head shows a small right ventral thalamic hemorrhage which is likely clinically silent but may be a hemorrhagic infarct from atrial fibrillation   Assessment / Plan / Recommendation Clinical Impression  Cognitive-linguistic evaluation complete. Cognitive status currently at baseline. Speech and language functional. Patient with intact problem solving and reasoning skills. Functional for return to pre-admission living status in which patient has assistance at home as needed. No f/u SLP needs indicated at this time.     SLP Assessment  Patient does not need any further Speech Lanaguage Pathology Services    Follow Up Recommendations  None       Pertinent Vitals/Pain None reported    SLP Evaluation Prior Functioning  Cognitive/Linguistic Baseline: Baseline deficits Baseline deficit details: sister reports some mild confusion for a few weeks prior to admission.  Type of Home: House Lives With: Son (although has been living with sisters  recently) Available Help at Discharge: Family   Cognition  Overall Cognitive Status: Within Functional Limits for tasks assessed Orientation Level: Oriented to person;Oriented to situation;Oriented to place;Oriented to time    Comprehension  Auditory Comprehension Overall Auditory Comprehension: Appears within functional limits for tasks assessed Visual Recognition/Discrimination Discrimination: Not tested Reading Comprehension Reading Status: Not tested (due to baseline visual impairements)    Expression Expression Primary Mode of Expression: Verbal Verbal Expression Overall Verbal Expression: Appears within functional limits for tasks assessed Written Expression Dominant Hand: Right   Oral / Motor Oral Motor/Sensory Function Overall Oral Motor/Sensory Function: Appears within functional limits for tasks assessed Motor Speech Overall Motor Speech: Appears within functional limits for tasks assessed   GO   Dana Lango MA, CCC-SLP [phone]   Dana Carr 11/12/2012, 11:20 AM

## 2012-11-13 LAB — GLUCOSE, CAPILLARY
Glucose-Capillary: 109 mg/dL — ABNORMAL HIGH (ref 70–99)
Glucose-Capillary: 105 mg/dL — ABNORMAL HIGH (ref 70–99)

## 2012-11-13 MED ORDER — FLECAINIDE ACETATE 50 MG PO TABS
50.0000 mg | ORAL_TABLET | Freq: Two times a day (BID) | ORAL | Status: DC
  Administered 2012-11-13 – 2012-11-15 (×4): 50 mg via ORAL
  Filled 2012-11-13 (×5): qty 1

## 2012-11-13 MED ORDER — REGADENOSON 0.4 MG/5ML IV SOLN: 0.4000 mg | Freq: Once | INTRAVENOUS | Status: DC

## 2012-11-13 MED ORDER — DILTIAZEM HCL ER COATED BEADS 120 MG PO CP24
120.0000 mg | ORAL_CAPSULE | Freq: Every day | ORAL | Status: DC
  Administered 2012-11-13 – 2012-11-15 (×3): 120 mg via ORAL
  Filled 2012-11-13 (×3): qty 1

## 2012-11-13 MED ORDER — DILTIAZEM HCL ER COATED BEADS 180 MG PO CP24
180.0000 mg | ORAL_CAPSULE | Freq: Every day | ORAL | Status: DC
  Filled 2012-11-13: qty 1

## 2012-11-13 MED ORDER — FLECAINIDE ACETATE 100 MG PO TABS
100.0000 mg | ORAL_TABLET | Freq: Two times a day (BID) | ORAL | Status: AC
  Administered 2012-11-13: 100 mg via ORAL
  Filled 2012-11-13 (×2): qty 1

## 2012-11-13 MED ORDER — CLORAZEPATE DIPOTASSIUM 3.75 MG PO TABS
7.5000 mg | ORAL_TABLET | Freq: Three times a day (TID) | ORAL | Status: DC | PRN
  Administered 2012-11-14: 7.5 mg via ORAL
  Filled 2012-11-13: qty 1

## 2012-11-13 MED ORDER — REGADENOSON 0.4 MG/5ML IV SOLN
0.4000 mg | Freq: Once | INTRAVENOUS | Status: AC
  Administered 2012-11-14: 0.4 mg via INTRAVENOUS
  Filled 2012-11-13: qty 5

## 2012-11-13 NOTE — Progress Notes (Signed)
Unable to do nuc today due to patient eating breakfast. Will plan for tomorrow. EP team/nursing/nuc med aware. Seema Blum PA-C

## 2012-11-13 NOTE — Progress Notes (Signed)
Physical Therapy Treatment Patient Details Name: Dana Carr MRN: [id number] DOB: 10/08/1940 Today's Date: 11/13/2012 Time: 1478-2956 PT Time Calculation (min): 19 min  PT Assessment / Plan / Recommendation Comments on Treatment Session  Progressing with improved stability with mobility today even with dynamic challenges to gait.  Feel limitations mainly visual in unfamiliar environment.  Did discuss need for contrast with obstacles in community and pt states she does well with that.  HR WNL, increased BP even at rest, pt states is her baseline.     Follow Up Recommendations  Supervision/Assistance - 24 hour;Home health PT (HHPT safety eval for sister's home)           Equipment Recommendations  None recommended by PT       Frequency Min 3X/week   Plan Discharge plan remains appropriate    Precautions / Restrictions Precautions Precautions: Fall Precaution Comments: decreased vision, esp decreased discrimination with low contrast   Pertinent Vitals/Pain Denies pain; HR 92, BP 135/107    Mobility  Bed Mobility Supine to Sit: 6: Modified independent (Device/Increase time);HOB elevated Sitting - Scoot to Edge of Bed: 6: Modified independent (Device/Increase time) Transfers Sit to Stand: 5: Supervision;From bed Stand to Sit: 5: Supervision;To chair/3-in-1 Details for Transfer Assistance: for safety due to multiple lines Ambulation/Gait Ambulation/Gait Assistance: 5: Supervision;4: Min guard Ambulation Distance (Feet): 400 Feet Assistive device: None Ambulation/Gait Assistance Details: minguard given for dynamic gait activities, gown and robe long and contribute to decreased step length Gait Pattern: Narrow base of support;Shuffle;Decreased stride length      PT Goals Acute Rehab PT Goals Pt will go Sit to Stand: with modified independence PT Goal: Sit to Stand - Progress: Progressing toward goal Pt will go Stand to Sit: with modified independence PT Goal: Stand to Sit -  Progress: Progressing toward goal Pt will Stand: Independently;6 - 10 min;with no upper extremity support PT Goal: Stand - Progress: Progressing toward goal Pt will Ambulate: >150 feet;with least restrictive assistive device;with modified independence PT Goal: Ambulate - Progress: Progressing toward goal  Visit Information  Last PT Received On: 11/13/12    Subjective Data  Subjective: I took some medicine and it made my heart race.   Cognition  Cognition Arousal/Alertness: Awake/alert Behavior During Therapy: WFL for tasks assessed/performed Overall Cognitive Status: Within Functional Limits for tasks assessed    Balance  High Level Balance High Level Balance Activites: Direction changes;Head turns;Turns;Sudden stops High Level Balance Comments: minguard for safety with dynamic activites  End of Session PT - End of Session Equipment Utilized During Treatment: Gait belt Activity Tolerance: Patient tolerated treatment well Patient left: in chair   GP     [facility] 11/13/2012, 10:04 AM Sheran Lawless, PT [phone] 11/13/2012

## 2012-11-13 NOTE — Progress Notes (Signed)
SUBJECTIVE: The patient is doing well today. She has converted to afib with RVR.  She reports palpitations but otherwise appears to be tolerating this.  At this time, she denies chest pain, shortness of breath, or any new concerns.  Marland Kitchen apixaban  2.5 mg Oral BID  . diltiazem  180 mg Oral Daily  . flecainide  100 mg Oral Q12H  . ketorolac  1 drop Right Eye Daily  . labetalol  200 mg Oral BID  . multivitamin-lutein  1 capsule Oral Daily  . pantoprazole  40 mg Oral Q1200  . prednisoLONE acetate  1 drop Right Eye Daily  . regadenoson  0.4 mg Intravenous Once  . senna-docusate  1 tablet Oral BID  . timolol  1 drop Both Eyes BID   . sodium chloride 50 mL/hr at 11/13/12 0200    OBJECTIVE: Physical Exam: Filed Vitals:   11/13/12 0500 11/13/12 0600 11/13/12 0700 11/13/12 0758  BP: 137/84 163/97 129/91   Pulse: 81 82 142   Temp:    98 F (36.7 C)  TempSrc:    Oral  Resp: 33 17 15   Height:      Weight:      SpO2: 96% 87% 97%     Intake/Output Summary (Last 24 hours) at 11/13/12 0800 Last data filed at 11/12/12 1900  Gross per 24 hour  Intake  752.5 ml  Output      0 ml  Net  752.5 ml    Telemetry reveals afib with RVR  GEN- The patient is well appearing, alert and oriented x 3 today.   Head- normocephalic, atraumatic Eyes-  Sclera clear, conjunctiva pink Ears- hearing intact Oropharynx- clear Neck- supple  Lungs- Clear to ausculation bilaterally, normal work of breathing Heart- irregular rate and rhythm  GI- soft, NT, ND, + BS Extremities- no clubbing, cyanosis, or edema Skin- no rash or lesion  LABS: Basic Metabolic Panel:  Recent Labs  81/19/14 0631 11/12/12 0504  NA 139 141  K 4.0 3.2*  CL 108 110  CO2 20 19  GLUCOSE 116* 103*  BUN 38* 25*  CREATININE 2.32* 1.82*  CALCIUM 8.9 9.3   Liver Function Tests:  Recent Labs  11/11/12 0047  AST 17  ALT 9  ALKPHOS 75  BILITOT 0.4  PROT 6.8  ALBUMIN 3.6   No results found for this basename: LIPASE,  AMYLASE,  in the last 72 hours CBC:  Recent Labs  11/11/12 0047  WBC 6.5  NEUTROABS 3.9  HGB 10.5*  HCT 30.7*  MCV 90.0  PLT 177   Cardiac Enzymes:  Recent Labs  11/11/12 0048  TROPONINI <0.30   BNP: No components found with this basename: POCBNP,  D-Dimer: No results found for this basename: DDIMER,  in the last 72 hours Hemoglobin A1C:  Recent Labs  11/11/12 0047  HGBA1C 5.7*   Fasting Lipid Panel:  Recent Labs  11/11/12 0631  CHOL 213*  HDL 48  LDLCALC 134*  TRIG 154*  CHOLHDL 4.4   Thyroid Function Tests:  Recent Labs  11/12/12 0504  TSH 0.918   Anemia Panel: No results found for this basename: VITAMINB12, FOLATE, FERRITIN, TIBC, IRON, RETICCTPCT,  in the last 72 hours  RADIOLOGY: Dg Chest 1 View  11/11/2012  *RADIOLOGY REPORT*  Clinical Data: Stroke  CHEST - 1 VIEW  Comparison: None.  Findings: The cardiac silhouette is mildly enlarged.  The aorta is tortuous.  No mediastinal or hilar masses.  The lungs are clear. The  bony thorax is diffusely demineralized but intact.  IMPRESSION: No acute cardiopulmonary disease.   Original Report Authenticated By: Amie Portland, M.D.    Ct Head Wo Contrast  11/11/2012  *RADIOLOGY REPORT*  Clinical Data: Code stroke, expressive aphasia, hypotension  CT HEAD WITHOUT CONTRAST  Technique:  Contiguous axial images were obtained from the base of the skull through the vertex without contrast.  Comparison: None.  Findings: There is focal hyperdensity in the right thalamus, image 16, measuring 4 mm.  Mild diffuse cortical volume loss noted with proportional ventricular prominence.  Periventricular white matter hypodensity is evident.  No midline shift.  No skull fracture. Orbits and paranasal sinuses are intact.  IMPRESSION: Focal hyperdensity within the right thalamus, less likely acute hemorrhage.  Periventricular white matter hypodensity, which is age indeterminate but could be anoxic change related to hypotension given the  provided clinical history.  Critical Value/emergent results were called by telephone at the time of interpretation on 11/11/2012 at 12:45 a.m. to Dr. Amada Jupiter, who verbally acknowledged these results.   Original Report Authenticated By: Christiana Pellant, M.D.    Mr [facility] Wo Contrast  11/11/2012  *RADIOLOGY REPORT*  Clinical Data:  Code stroke.  Expressive aphasia.  T-PA given.  MRI HEAD WITHOUT CONTRAST MRA HEAD WITHOUT CONTRAST  Technique:  Multiplanar, multiecho pulse sequences of the brain and surrounding structures were obtained without intravenous contrast. Angiographic images of the head were obtained using MRA technique without contrast.  Comparison:  CT head 11/11/2012  MRI HEAD  Findings:  Negative for acute ischemic infarct.  Diffusion weighted imaging shows no area of restricted diffusion.  Small area of susceptibility in the right thalamus.  This was hyperdense on CT.  Based on the MRI, I would favor this is an area of chronic hemorrhage with calcification and iron deposition. Recent hemorrhage not excluded.  There is a small area of chronic hemorrhage in the right ventral pons.  There is moderate to advanced chronic microvascular ischemia in the white matter and brainstem.  No cortical infarct.  Negative for mass lesion.  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  IMPRESSION: Negative for acute ischemic infarct.  Moderate to advanced chronic microvascular ischemia.  Small area of susceptibility in the right pons, favor chronic hemorrhage.  This is hyperdense on CT and recent hemorrhage cannot be completely excluded.  There is also a smaller chronic hemorrhage in the right pons.  MRA HEAD  Findings: Both vertebral arteries are tortuous but patent to the basilar.  The basilar is mildly enlarged but widely patent. Superior cerebellar and posterior cerebral arteries are patent bilaterally.  Right internal carotid artery is tortuous and widely patent.  Right anterior and middle cerebral arteries are  widely patent.  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  There is flow in the left anterior and middle cerebral arteries which are supplied via of the anterior communicating  artery and the left posterior communicating artery.  Both posterior communicating arteries are patent.  Negative for aneurysm.  IMPRESSION: Occluded left internal carotid artery.  There is good collateral circulation in the circle of Willis with patent anterior middle cerebral arteries on the left.  Tortuous intracranial vessels suggesting chronic hypertension.   Original Report Authenticated By: Janeece Riggers, M.D.    Mr Brain Wo Contrast  11/11/2012  *RADIOLOGY REPORT*  Clinical Data:  Code stroke.  Expressive aphasia.  T-PA given.  MRI HEAD WITHOUT CONTRAST MRA HEAD WITHOUT CONTRAST  Technique:  Multiplanar, multiecho pulse sequences of the brain and  surrounding structures were obtained without intravenous contrast. Angiographic images of the head were obtained using MRA technique without contrast.  Comparison:  CT head 11/11/2012  MRI HEAD  Findings:  Negative for acute ischemic infarct.  Diffusion weighted imaging shows no area of restricted diffusion.  Small area of susceptibility in the right thalamus.  This was hyperdense on CT.  Based on the MRI, I would favor this is an area of chronic hemorrhage with calcification and iron deposition. Recent hemorrhage not excluded.  There is a small area of chronic hemorrhage in the right ventral pons.  There is moderate to advanced chronic microvascular ischemia in the white matter and brainstem.  No cortical infarct.  Negative for mass lesion.  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  IMPRESSION: Negative for acute ischemic infarct.  Moderate to advanced chronic microvascular ischemia.  Small area of susceptibility in the right pons, favor chronic hemorrhage.  This is hyperdense on CT and recent hemorrhage cannot be completely excluded.  There is also a smaller chronic hemorrhage in the  right pons.  MRA HEAD  Findings: Both vertebral arteries are tortuous but patent to the basilar.  The basilar is mildly enlarged but widely patent. Superior cerebellar and posterior cerebral arteries are patent bilaterally.  Right internal carotid artery is tortuous and widely patent.  Right anterior and middle cerebral arteries are widely patent.  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  There is flow in the left anterior and middle cerebral arteries which are supplied via of the anterior communicating  artery and the left posterior communicating artery.  Both posterior communicating arteries are patent.  Negative for aneurysm.  IMPRESSION: Occluded left internal carotid artery.  There is good collateral circulation in the circle of Willis with patent anterior middle cerebral arteries on the left.  Tortuous intracranial vessels suggesting chronic hypertension.   Original Report Authenticated By: Janeece Riggers, M.D.     ASSESSMENT AND PLAN:  Active Problems:   ICH (intracerebral hemorrhage)   Accelerated hypertension  1. Afib  The patient has paroxysmal atrial fibrillation documented.  She is back in afib today.  Dr Pearlean Brownie has initiated eliquis for stroke prevention.  I will start flecainide 100mg  BID today and once back in sinus decrease to 50mg  daily.  I will also add cardizem for rate control.   Echo and TFTs are reviewed   2. HTN  She likely had a hypertensive crisis which lead to her hospitalization. In the short term, I will defer BP control to the stroke service. Titrate labetalol as above.  She will need close outpatient BP follow-up  Given PVD, will proceed with lexiscan myoview to evaluate for CAD.  IF normal then we can use flecainide long term.  If abnormal then we will need to consider sotalol or amiodarone as alternatives.   Hillis Range, MD 11/13/2012 8:00 AM

## 2012-11-13 NOTE — Progress Notes (Signed)
Stroke Team Progress Note  HISTORY Dana Carr is a 72 y.o. female a history of severe hypertension who earlier tonight around 11:15 PM walked into the kitchen, called out her daughter's name and then became unresponsive. When EMS arrival, she was moving her left side but not her right and was nonverbal. En route, she began having some speech and began moving her right side well. On arrival to the ER, she remained aphasic but was able. Per EMS, in the field they observed afib, but has been in sinus rhythm here. Patient was not a TPA candidate secondary to Ct showing hemorrhage and rapid resolution . She was admitted to the neuro ICU for further evaluation and treatment.  SUBJECTIVE Family at bedside. Back in rapid afib during the night. Cardiology consulted.  OBJECTIVE Most recent Vital Signs: Filed Vitals:   11/13/12 0600 11/13/12 0700 11/13/12 0758 11/13/12 0800  BP: 163/97 129/91  151/97  Pulse: 82 142    Temp:   98 F (36.7 C)   TempSrc:   Oral   Resp: 17 15    Height:      Weight:      SpO2: 87% 97%     CBG (last 3)   Recent Labs  11/12/12 1713 11/12/12 2202 11/13/12 0757  GLUCAP 106* 115* 105*   IV Fluid Intake:   . sodium chloride 50 mL/hr at 11/13/12 0200   MEDICATIONS  . apixaban  2.5 mg Oral BID  . diltiazem  120 mg Oral Daily  . flecainide  100 mg Oral Q12H  . flecainide  50 mg Oral Q12H  . ketorolac  1 drop Right Eye Daily  . labetalol  200 mg Oral BID  . multivitamin-lutein  1 capsule Oral Daily  . pantoprazole  40 mg Oral Q1200  . prednisoLONE acetate  1 drop Right Eye Daily  . [START ON 11/14/2012] regadenoson  0.4 mg Intravenous Once  . senna-docusate  1 tablet Oral BID  . timolol  1 drop Both Eyes BID   PRN:  acetaminophen, acetaminophen, calcium carbonate, labetalol, ondansetron (ZOFRAN) IV, sodium chloride, traMADol  Diet:  Cardiac   Activity: OOB DVT Prophylaxis:  SCDs CLINICALLY SIGNIFICANT STUDIES Basic Metabolic Panel:   Recent Labs Lab  11/11/12 0631 11/12/12 0504  NA 139 141  K 4.0 3.2*  CL 108 110  CO2 20 19  GLUCOSE 116* 103*  BUN 38* 25*  CREATININE 2.32* 1.82*  CALCIUM 8.9 9.3   Liver Function Tests:   Recent Labs Lab 11/11/12 0047  AST 17  ALT 9  ALKPHOS 75  BILITOT 0.4  PROT 6.8  ALBUMIN 3.6   CBC:   Recent Labs Lab 11/11/12 0047  WBC 6.5  NEUTROABS 3.9  HGB 10.5*  HCT 30.7*  MCV 90.0  PLT 177   Coagulation:   Recent Labs Lab 11/11/12 0047  LABPROT 14.1  INR 1.10   Cardiac Enzymes:   Recent Labs Lab 11/11/12 0048  TROPONINI <0.30   Urinalysis:   Recent Labs Lab 11/11/12 0814  COLORURINE YELLOW  LABSPEC 1.013  PHURINE 5.5  GLUCOSEU NEGATIVE  HGBUR NEGATIVE  BILIRUBINUR NEGATIVE  KETONESUR NEGATIVE  PROTEINUR 100*  UROBILINOGEN 0.2  NITRITE NEGATIVE  LEUKOCYTESUR TRACE*   Lipid Panel     Component Value Date/Time   CHOL 213* 11/11/2012 0631   TRIG 154* 11/11/2012 0631   HDL 48 11/11/2012 0631   CHOLHDL 4.4 11/11/2012 0631   VLDL 31 11/11/2012 0631   LDLCALC 134* 11/11/2012 0631  HgbA1C  Lab Results  Component Value Date   HGBA1C 5.7* 11/11/2012    Urine Drug Screen:   No results found for this basename: labopia,  cocainscrnur,  labbenz,  amphetmu,  thcu,  labbarb    Alcohol Level: No results found for this basename: ETH,  in the last 168 hours  CT Head 11/11/2012  Focal hyperdensity within the right thalamus, less likely acute hemorrhage.  Periventricular white matter hypodensity, which is age indeterminate but could be anoxic change related to hypotension given the provided clinical history.    MRI of the brain  11/11/2012  Negative for acute ischemic infarct.  Moderate to advanced chronic microvascular ischemia.  Small area of susceptibility in the right pons, favor chronic hemorrhage.  This is hyperdense on CT and recent hemorrhage cannot be completely excluded.  There is also a smaller chronic hemorrhage in the right pons.    MRA of the brain  11/11/2012   Occluded left internal carotid artery.  There is good collateral circulation in the circle of Willis with patent anterior middle cerebral arteries on the left.  Tortuous intracranial vessels suggesting chronic hypertension.    2D Echocardiogram  EF 50-55% with no source of embolus.   Carotid Doppler  Right There appears to be a dissection in the most distal CCA / Bulb region. Left - ICA is occluded. Bilateral vertebral artery flow is antegrade.   CXR 11/11/2012   No acute cardiopulmonary disease.      EKG   SINUS TACHYCARDIA  Physical Exam : frail elderly pleasant Caucasian lady not in distress.Awake alert. Afebrile. Head is nontraumatic. Neck is supple without bruit. Hearing is normal. Cardiac exam no murmur or gallop. Lungs are clear to auscultation. Distal pulses are well felt. Neurological Exam ;  Awake  Alert oriented x 3. Normal speech and language.eye movements full without nystagmus.fundi were not visualized. Vision acuity and fields appear normal. Hearing is normal. Palatal movements are normal. Face symmetric. Tongue midline. Normal strength, tone, reflexes and coordination. Normal sensation. Gait deferred. IMPRESSION [name] is a 72 y.o. female presenting with syncopal episode and transient right sided weakness in setting of atrial fibrillation with rapid ventricular response. CT head and MRI head confirms a small right thalamic and pontine hemorrhage which is clinically silent and represents remote microhemorrhage from chronic uncontrolled hypertensive microvascular disease, not acute hemorrhage, no acute stroke. Dx: left brain TIA.  On no  Antiplatelets prior to admission; had been on coumadin in the past. Now on eliquis. Patient with no resultant neuro deficits. Work up completed.   Accelerated Hypertension BP 183/125, off cardene. Stable today 140-160s. Will not add BP meds at this time.  Paroxysmal atrial fibrillation with rapid ventricular response. Had new episode during  the night. Started on Flecainide.  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 Patient was scheduled tid at home, but only took as needed.  Hospital day # 2  TREATMENT/PLAN  eliquis for secondary stroke prevention  Change Tranxene to prn  SBP goal < 180, DBP < 100  Mobilize out of bed physical occupational therapy consults  CBC and BMET in am  Annie Main, MSN, RN, ANVP-BC, ANP-BC, [facility] Pager: [phone] 11/13/2012 9:20 AM  I have personally obtained a history, examined the patient, evaluated imaging results, and formulated the assessment and plan of care. I agree with the above.  Delia Heady, MD

## 2012-11-13 NOTE — Clinical Documentation Improvement (Signed)
RENAL FAILURE DOCUMENTATION CLARIFICATION QUERY  THIS DOCUMENT IS NOT A PERMANENT PART OF THE MEDICAL RECORD  Please update your documentation within the medical record to reflect your response to this query.                                                                                    11/13/12  Dear Dana Carr,  In a better effort to capture your patient's severity of illness, reflect appropriate length of stay and utilization of resources, a review of the patient medical record has revealed the following indicators.   Based on your clinical judgment, please clarify and document in a progress note and/or discharge summary the clinical condition associated with the following supporting information: In responding to this query please exercise your independent judgment.  The fact that a query is asked, does not imply that any particular answer is desired or expected.   Hello Dana Carr!  [name] was admitted with small right thalamic and  Pontine hemorrhage. Per progress note: "Chronic renal insufficiency, Cr 1.82". Also,  Component      Creatinine  Latest Ref Rng      0.50 - 1.10 mg/dL  1/61/0960     45:40 AM 2.58 (H)  11/11/2012     6:31 AM 2.32 (H)  11/12/2012      1.82 (H)  If possible, please help clarify the suspected diagnosis related to these abnormal laboratory values. Thank you!   Possible Clinical Conditions?  - Acute Renal Failure  - Acute Kidney Injury  - Other condition (please document in the progress notes and/or discharge summary)  - Cannot Clinically determine at this time       Reviewed: additional documentation in the medical record 11/07/2012 note.  Thank You,  Saul Fordyce  Clinical Documentation Specialist: [phone] Pager  [facility] [place]

## 2012-11-13 NOTE — ED Provider Notes (Addendum)
History     CSN: [id number]  Arrival date & time 11/11/12  0026   First MD Initiated Contact with Patient 11/11/12 0056      Chief Complaint  Patient presents with  . Code Stroke    HPI Dana Carr is a 72 y.o. female history of hypertension, peripheral vascular disease presents as a code stroke.  Patient was at home called her daughter then collapsed and became unresponsive. On EMS arrival and found her to be in atrial fibrillation, she was not moving the left side of her body.  On the way to the hospital patient began moving the left side of her body began speaking again -  On initial neurologist evaluation patient was the phasic, on my evaluation patient is speaking, mildly confused she is alert and oriented to herself and that she is in the hospital but is uncertain of the date.  Patient not complaining about any pain at this time, she denies any chest pain, shortness of breath, abdominal pain, nausea vomiting, diarrhea, neck pain, back pain. Symptoms have been severe, resolving, no known alleviating or exacerbating factors.  Past Medical History  Diagnosis Date  . Hypertension   . Cervical cancer   . Breast cancer   . Peripheral vascular disease   . Renal artery stenosis   . Chronic renal insufficiency     Past Surgical History  Procedure Laterality Date  . Repair of perforated ulcer N/A     ulceration * 2 of the esophagus  . Thromboendarterectomy    . 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 Eye surgery Right cataract repair    Family History  Problem Relation Age of Onset  . Atrial fibrillation Brother     History  Substance Use Topics  . Smoking status: Former Smoker -- 0.25 packs/day for 40 years    Types: Cigarettes    Quit date: 12/31/2011  . Smokeless tobacco: Never Used  . Alcohol Use: No    OB History   Grav Para Term Preterm Abortions TAB SAB Ect Mult Living                  Review of Systems At least 10pt or greater review of  systems completed and are negative except where specified in the HPI.  Allergies  Haldol; Tape; and Vasotec  Home Medications  No current outpatient prescriptions on file.  BP 156/93  Pulse 108  Temp(Src) 98 F (36.7 C) (Oral)  Resp 18  Ht 5\' 4"  (1.626 m)  Wt 109 lb 9.1 oz (49.7 kg)  BMI 18.8 kg/m2  SpO2 96%  Physical Exam  PHYSICAL EXAM: VITAL SIGNS:  . Filed Vitals:   11/12/12 2100 11/12/12 2200 11/12/12 2201 11/12/12 2300  BP: 146/95 156/93 156/93   Pulse: 88 89 108   Temp:    98 F (36.7 C)  TempSrc:    Oral  Resp: 27 18    Height:      Weight:      SpO2: 97% 96%     CONSTITUTIONAL: Awake, oriented to self and place, appears non-toxic HENT: Atraumatic, normocephalic, oral mucosa pink and moist, airway patent. Nares patent without drainage. External ears normal. EYES: Conjunctiva clear, EOMI, PERRLA NECK: Trachea midline, non-tender, supple CARDIOVASCULAR: Tachycardic, Normal rhythm, No murmurs, rubs, gallops PULMONARY/CHEST: Clear to auscultation, no rhonchi, wheezes, or rales. Symmetrical breath sounds. CHEST WALL: No lesions. Non-tender. ABDOMINAL: Non-distended, soft, non-tender - no rebound or guarding.  BS normal. NEUROLOGIC: CN: PERRLA,  EOMI.  Facial sensation equal to light touch bilaterally.  Good muscle bulk in the masseter muscle and good lateral movement of the jaw.  Facial expressions equal and good strength with smile/frown and puffed cheeks.  Hearing grossly intact to finger rub test.  Uvula, tongue are midline with no deviation. Symmetrical palate elevation.  Trapezius and SCM muscles are 5/5 strength bilaterally.   DTR: Brachioradialis, biceps, patellar, Achilles tendon reflexes 2+ bilaterally.  No clonus. Strength: 5/5 strength flexors and extensors in the upper and lower extremities.  Grip strength, finger adduction/abduction 5/5. Sensation: Sensation intact distally to light touch EXTREMITIES: No clubbing, cyanosis, or edema SKIN: Warm, Dry, No  erythema, No rash   ED Course  CRITICAL CARE Performed by: Jones Skene Authorized by: Jones Skene Total critical care time: 30 minutes Critical care was necessary to treat or prevent imminent or life-threatening deterioration of the following conditions: CNS failure or compromise. Critical care was time spent personally by me on the following activities: discussions with consultants, evaluation of patient's response to treatment, obtaining history from patient or surrogate, ordering and review of laboratory studies, pulse oximetry, re-evaluation of patient's condition, review of old charts, ordering and performing treatments and interventions, ordering and review of radiographic studies, examination of patient and development of treatment plan with patient or surrogate. Comments: Code stroke   (including critical care time)  Date: 11/13/2012  Rate: 104  Rhythm:  Sinus tachycardia  QRS Axis: normal  Intervals: normal  ST/T Wave abnormalities: T-wave flattening in V2 V3, some inversion in V4 through V6  Conduction Disutrbances: none  Narrative Interpretation: Nonspecific T-wave abnormalities, no acute signs of acute ischemia or infarction, no prior EKG for comparison   Labs Reviewed  CBC - Abnormal; Notable for the following:    RBC 3.41 (*)    Hemoglobin 10.5 (*)    HCT 30.7 (*)    All other components within normal limits  COMPREHENSIVE METABOLIC PANEL - Abnormal; Notable for the following:    Glucose, Bld 132 (*)    BUN 42 (*)    Creatinine, Ser 2.58 (*)    GFR calc non Af Amer 18 (*)    GFR calc Af Amer 21 (*)    All other components within normal limits  GLUCOSE, CAPILLARY - Abnormal; Notable for the following:    Glucose-Capillary 123 (*)    All other components within normal limits  URINALYSIS, ROUTINE W REFLEX MICROSCOPIC - Abnormal; Notable for the following:    Protein, ur 100 (*)    Leukocytes, UA TRACE (*)    All other components within normal limits  BASIC  METABOLIC PANEL - Abnormal; Notable for the following:    Glucose, Bld 116 (*)    BUN 38 (*)    Creatinine, Ser 2.32 (*)    GFR calc non Af Amer 20 (*)    GFR calc Af Amer 23 (*)    All other components within normal limits  GLUCOSE, CAPILLARY - Abnormal; Notable for the following:    Glucose-Capillary 102 (*)    All other components within normal limits  URINE MICROSCOPIC-ADD ON - Abnormal; Notable for the following:    Squamous Epithelial / LPF FEW (*)    Bacteria, UA FEW (*)    All other components within normal limits  LIPID PANEL - Abnormal; Notable for the following:    Cholesterol 213 (*)    Triglycerides 154 (*)    LDL Cholesterol 134 (*)    All other components within normal  limits  HEMOGLOBIN A1C - Abnormal; Notable for the following:    Hemoglobin A1C 5.7 (*)    Mean Plasma Glucose 117 (*)    All other components within normal limits  GLUCOSE, CAPILLARY - Abnormal; Notable for the following:    Glucose-Capillary 124 (*)    All other components within normal limits  BASIC METABOLIC PANEL - Abnormal; Notable for the following:    Potassium 3.2 (*)    Glucose, Bld 103 (*)    BUN 25 (*)    Creatinine, Ser 1.82 (*)    GFR calc non Af Amer 27 (*)    GFR calc Af Amer 31 (*)    All other components within normal limits  GLUCOSE, CAPILLARY - Abnormal; Notable for the following:    Glucose-Capillary 112 (*)    All other components within normal limits  GLUCOSE, CAPILLARY - Abnormal; Notable for the following:    Glucose-Capillary 132 (*)    All other components within normal limits  GLUCOSE, CAPILLARY - Abnormal; Notable for the following:    Glucose-Capillary 119 (*)    All other components within normal limits  GLUCOSE, CAPILLARY - Abnormal; Notable for the following:    Glucose-Capillary 118 (*)    All other components within normal limits  GLUCOSE, CAPILLARY - Abnormal; Notable for the following:    Glucose-Capillary 106 (*)    All other components within normal  limits  GLUCOSE, CAPILLARY - Abnormal; Notable for the following:    Glucose-Capillary 115 (*)    All other components within normal limits  POCT I-STAT TROPONIN I - Abnormal; Notable for the following:    Troponin i, poc 0.09 (*)    All other components within normal limits  MRSA PCR SCREENING  URINE CULTURE  PROTIME-INR  APTT  DIFFERENTIAL  TROPONIN I  TSH   Dg Chest 1 View  11/11/2012  *RADIOLOGY REPORT*  Clinical Data: Stroke  CHEST - 1 VIEW  Comparison: None.  Findings: The cardiac silhouette is mildly enlarged.  The aorta is tortuous.  No mediastinal or hilar masses.  The lungs are clear. The bony thorax is diffusely demineralized but intact.  IMPRESSION: No acute cardiopulmonary disease.   Original Report Authenticated By: Amie Portland, M.D.    Ct Head Wo Contrast  11/11/2012  *RADIOLOGY REPORT*  Clinical Data: Code stroke, expressive aphasia, hypotension  CT HEAD WITHOUT CONTRAST  Technique:  Contiguous axial images were obtained from the base of the skull through the vertex without contrast.  Comparison: None.  Findings: There is focal hyperdensity in the right thalamus, image 16, measuring 4 mm.  Mild diffuse cortical volume loss noted with proportional ventricular prominence.  Periventricular white matter hypodensity is evident.  No midline shift.  No skull fracture. Orbits and paranasal sinuses are intact.  IMPRESSION: Focal hyperdensity within the right thalamus, less likely acute hemorrhage.  Periventricular white matter hypodensity, which is age indeterminate but could be anoxic change related to hypotension given the provided clinical history.  Critical Value/emergent results were called by telephone at the time of interpretation on 11/11/2012 at 12:45 a.m. to Dr. Amada Jupiter, who verbally acknowledged these results.   Original Report Authenticated By: Christiana Pellant, M.D.    Mr [facility] Wo Contrast  11/11/2012  *RADIOLOGY REPORT*  Clinical Data:  Code stroke.  Expressive  aphasia.  T-PA given.  MRI HEAD WITHOUT CONTRAST MRA HEAD WITHOUT CONTRAST  Technique:  Multiplanar, multiecho pulse sequences of the brain and surrounding structures were obtained without intravenous contrast. Angiographic  images of the head were obtained using MRA technique without contrast.  Comparison:  CT head 11/11/2012  MRI HEAD  Findings:  Negative for acute ischemic infarct.  Diffusion weighted imaging shows no area of restricted diffusion.  Small area of susceptibility in the right thalamus.  This was hyperdense on CT.  Based on the MRI, I would favor this is an area of chronic hemorrhage with calcification and iron deposition. Recent hemorrhage not excluded.  There is a small area of chronic hemorrhage in the right ventral pons.  There is moderate to advanced chronic microvascular ischemia in the white matter and brainstem.  No cortical infarct.  Negative for mass lesion.  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  IMPRESSION: Negative for acute ischemic infarct.  Moderate to advanced chronic microvascular ischemia.  Small area of susceptibility in the right pons, favor chronic hemorrhage.  This is hyperdense on CT and recent hemorrhage cannot be completely excluded.  There is also a smaller chronic hemorrhage in the right pons.  MRA HEAD  Findings: Both vertebral arteries are tortuous but patent to the basilar.  The basilar is mildly enlarged but widely patent. Superior cerebellar and posterior cerebral arteries are patent bilaterally.  Right internal carotid artery is tortuous and widely patent.  Right anterior and middle cerebral arteries are widely patent.  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  There is flow in the left anterior and middle cerebral arteries which are supplied via of the anterior communicating  artery and the left posterior communicating artery.  Both posterior communicating arteries are patent.  Negative for aneurysm.  IMPRESSION: Occluded left internal carotid artery.  There  is good collateral circulation in the circle of Willis with patent anterior middle cerebral arteries on the left.  Tortuous intracranial vessels suggesting chronic hypertension.   Original Report Authenticated By: Janeece Riggers, M.D.    Mr Brain Wo Contrast  11/11/2012  *RADIOLOGY REPORT*  Clinical Data:  Code stroke.  Expressive aphasia.  T-PA given.  MRI HEAD WITHOUT CONTRAST MRA HEAD WITHOUT CONTRAST  Technique:  Multiplanar, multiecho pulse sequences of the brain and surrounding structures were obtained without intravenous contrast. Angiographic images of the head were obtained using MRA technique without contrast.  Comparison:  CT head 11/11/2012  MRI HEAD  Findings:  Negative for acute ischemic infarct.  Diffusion weighted imaging shows no area of restricted diffusion.  Small area of susceptibility in the right thalamus.  This was hyperdense on CT.  Based on the MRI, I would favor this is an area of chronic hemorrhage with calcification and iron deposition. Recent hemorrhage not excluded.  There is a small area of chronic hemorrhage in the right ventral pons.  There is moderate to advanced chronic microvascular ischemia in the white matter and brainstem.  No cortical infarct.  Negative for mass lesion.  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  IMPRESSION: Negative for acute ischemic infarct.  Moderate to advanced chronic microvascular ischemia.  Small area of susceptibility in the right pons, favor chronic hemorrhage.  This is hyperdense on CT and recent hemorrhage cannot be completely excluded.  There is also a smaller chronic hemorrhage in the right pons.  MRA HEAD  Findings: Both vertebral arteries are tortuous but patent to the basilar.  The basilar is mildly enlarged but widely patent. Superior cerebellar and posterior cerebral arteries are patent bilaterally.  Right internal carotid artery is tortuous and widely patent.  Right anterior and middle cerebral arteries are widely patent.  The left internal  carotid artery is occluded.  There is flow in the left anterior and middle cerebral arteries which are supplied via of the anterior communicating  artery and the left posterior communicating artery.  Both posterior communicating arteries are patent.  Negative for aneurysm.  IMPRESSION: Occluded left internal carotid artery.  There is good collateral circulation in the circle of Willis with patent anterior middle cerebral arteries on the left.  Tortuous intracranial vessels suggesting chronic hypertension.   Original Report Authenticated By: Janeece Riggers, M.D.      1. ICH (intracerebral hemorrhage)   2. Accelerated hypertension   3. Atrial fibrillation with rapid ventricular response   4. Syncope       MDM  Patient arrives with code stroke, patient seems to be resolving. CT patient's head on arrival shows right thalamic hyperdensity less likely infarct. In discussion with Dr. Amada Jupiter, patient is not a TPA candidate, patient may have a intrathalamic bleed versus severe TIAs or both. Patient will be admitted to the neuro ICU for continued monitoring and further diagnostic testing. Patient is stable. Do not suspect any concomitant acute coronary syndrome, patient has no complaints and is comfortable at this time.         Jones Skene, MD 11/13/12 0020  Jones Skene, MD 11/13/12 [id number]

## 2012-11-14 ENCOUNTER — Inpatient Hospital Stay (HOSPITAL_COMMUNITY): Payer: Medicare Other

## 2012-11-14 DIAGNOSIS — R079 Chest pain, unspecified: Secondary | ICD-10-CM

## 2012-11-14 LAB — CBC
HCT: 28.6 % — ABNORMAL LOW (ref 36.0–46.0)
Hemoglobin: 9.7 g/dL — ABNORMAL LOW (ref 12.0–15.0)
WBC: 6.9 10*3/uL (ref 4.0–10.5)

## 2012-11-14 LAB — BASIC METABOLIC PANEL
BUN: 29 mg/dL — ABNORMAL HIGH (ref 6–23)
Chloride: 109 mEq/L (ref 96–112)
GFR calc Af Amer: 28 mL/min — ABNORMAL LOW (ref >90)
Glucose, Bld: 95 mg/dL (ref 70–99)
Potassium: 3.4 mEq/L — ABNORMAL LOW (ref 3.5–5.1)

## 2012-11-14 LAB — GLUCOSE, CAPILLARY: Glucose-Capillary: 91 mg/dL (ref 70–99)

## 2012-11-14 MED ORDER — TECHNETIUM TC 99M SESTAMIBI GENERIC - CARDIOLITE
30.0000 | Freq: Once | INTRAVENOUS | Status: AC | PRN
  Administered 2012-11-14: 30 via INTRAVENOUS

## 2012-11-14 MED ORDER — TECHNETIUM TC 99M SESTAMIBI GENERIC - CARDIOLITE
10.0000 | Freq: Once | INTRAVENOUS | Status: AC | PRN
  Administered 2012-11-14: 10 via INTRAVENOUS

## 2012-11-14 NOTE — Progress Notes (Signed)
Patient transporting to nuclear med.  Frankey Shown RN

## 2012-11-14 NOTE — Progress Notes (Signed)
   CARE MANAGEMENT NOTE 11/14/2012  Patient:  Dana Carr,Dana Carr   Account Number:  [id number]  Date Initiated:  11/14/2012  Documentation initiated by:  [facility]  Subjective/Objective Assessment:   ICH     Action/Plan:   HHPT rec with 24 hour Supervision   Anticipated DC Date:  11/17/2012   Anticipated DC Plan:  HOME W HOME HEALTH SERVICES      DC Planning Services  CM consult      Choice offered to / List presented to:          [employer] arranged  HH - 11 Patient Refused      Status of service:  Completed, signed off Medicare Important Message given?   (If response is "NO", the following Medicare IM given date fields will be blank) Date Medicare IM given:   Date Additional Medicare IM given:    Discharge Disposition:  HOME/SELF CARE  Per UR Regulation:    If discussed at Long Length of Stay Meetings, dates discussed:    Comments:  11/14/2012 1615 NCM spoke to pt and offered choice for [facility]. States she is living here in [place] with sister, Selmer Dominion [phone]. Gave permission to speak to sister. Pt is refusing HH PT. States her blood pressure runs high but she can manage at home. NCM recommended HH RN to follow up post dc. Pt states she checks her blood pressure at home and takes meds as prescribed. States she is able to ambulate without any DME. She is refusing HH RN and PT at this time. Contacted sister and states she is going to be with pt 24 hours the first few days she is home. And have adequate support system to ensure her safety at home. Explained to pt and sister if they decided [facility] is needed that her PCP can order the [facility] from the office. Explained [facility] goes to both [facility] and [employer]. Pt states she will stay with her sister for a few months and then go back to [facility]. Her son, Amariana Mirando lives with her at home in [place]. Provided pt with [facility] list for [facility]. Isidoro Donning RN CCM Case Mgmt phone [phone]

## 2012-11-14 NOTE — Progress Notes (Signed)
Stroke Team Progress Note  HISTORY Dana Carr is a 72 y.o. female a history of severe hypertension who earlier tonight around 11:15 PM walked into the kitchen, called out her daughter's name and then became unresponsive. When EMS arrival, she was moving her left side but not her right and was nonverbal. En route, she began having some speech and began moving her right side well. On arrival to the ER, she remained aphasic but was able. Per EMS, in the field they observed afib, but has been in sinus rhythm here. Patient was not a TPA candidate secondary to Ct showing hemorrhage and rapid resolution . She was admitted to the neuro ICU for further evaluation and treatment.  SUBJECTIVE Family at bedside. Patient is in nuclear medicine at 1113am. Patient was seen in nuclear medicine by Dr. Pearlean Brownie at 1225.  OBJECTIVE Most recent Vital Signs: Filed Vitals:   11/14/12 1033 11/14/12 1035 11/14/12 1037 11/14/12 1039  BP: 188/110 178/109 169/110 175/107  Pulse:      Temp:      TempSrc:      Resp:      Height:      Weight:      SpO2:       CBG (last 3)   Recent Labs  11/13/12 1749 11/13/12 2222 11/14/12 0655  GLUCAP 102* 105* 91   IV Fluid Intake:   . sodium chloride 50 mL/hr at 11/14/12 0529   MEDICATIONS  . apixaban  2.5 mg Oral BID  . diltiazem  120 mg Oral Daily  . flecainide  50 mg Oral Q12H  . ketorolac  1 drop Right Eye Daily  . labetalol  200 mg Oral BID  . multivitamin-lutein  1 capsule Oral Daily  . pantoprazole  40 mg Oral Q1200  . prednisoLONE acetate  1 drop Right Eye Daily  . senna-docusate  1 tablet Oral BID  . timolol  1 drop Both Eyes BID   PRN:  acetaminophen, acetaminophen, calcium carbonate, clorazepate, labetalol, ondansetron (ZOFRAN) IV, sodium chloride, traMADol  Diet:  NPO   Activity: OOB DVT Prophylaxis:  SCDs CLINICALLY SIGNIFICANT STUDIES Basic Metabolic Panel:   Recent Labs Lab 11/12/12 0504 11/14/12 0520  NA 141 140  K 3.2* 3.4*  CL 110 109   CO2 19 21  GLUCOSE 103* 95  BUN 25* 29*  CREATININE 1.82* 1.97*  CALCIUM 9.3 9.4   Liver Function Tests:   Recent Labs Lab 11/11/12 0047  AST 17  ALT 9  ALKPHOS 75  BILITOT 0.4  PROT 6.8  ALBUMIN 3.6   CBC:   Recent Labs Lab 11/11/12 0047 11/14/12 0520  WBC 6.5 6.9  NEUTROABS 3.9  --   HGB 10.5* 9.7*  HCT 30.7* 28.6*  MCV 90.0 89.4  PLT 177 192   Coagulation:   Recent Labs Lab 11/11/12 0047  LABPROT 14.1  INR 1.10   Cardiac Enzymes:   Recent Labs Lab 11/11/12 0048  TROPONINI <0.30   Urinalysis:   Recent Labs Lab 11/11/12 0814  COLORURINE YELLOW  LABSPEC 1.013  PHURINE 5.5  GLUCOSEU NEGATIVE  HGBUR NEGATIVE  BILIRUBINUR NEGATIVE  KETONESUR NEGATIVE  PROTEINUR 100*  UROBILINOGEN 0.2  NITRITE NEGATIVE  LEUKOCYTESUR TRACE*   Lipid Panel     Component Value Date/Time   CHOL 213* 11/11/2012 0631   TRIG 154* 11/11/2012 0631   HDL 48 11/11/2012 0631   CHOLHDL 4.4 11/11/2012 0631   VLDL 31 11/11/2012 0631   LDLCALC 134* 11/11/2012 0631   HgbA1C  Lab Results  Component Value Date   HGBA1C 5.7* 11/11/2012    Urine Drug Screen:   No results found for this basename: labopia,  cocainscrnur,  labbenz,  amphetmu,  thcu,  labbarb    Alcohol Level: No results found for this basename: ETH,  in the last 168 hours  CT Head 11/11/2012  Focal hyperdensity within the right thalamus, less likely acute hemorrhage.  Periventricular white matter hypodensity, which is age indeterminate but could be anoxic change related to hypotension given the provided clinical history.    MRI of the brain  11/11/2012  Negative for acute ischemic infarct.  Moderate to advanced chronic microvascular ischemia.  Small area of susceptibility in the right pons, favor chronic hemorrhage.  This is hyperdense on CT and recent hemorrhage cannot be completely excluded.  There is also a smaller chronic hemorrhage in the right pons.    MRA of the brain  11/11/2012  Occluded left internal  carotid artery.  There is good collateral circulation in the circle of Willis with patent anterior middle cerebral arteries on the left.  Tortuous intracranial vessels suggesting chronic hypertension.    2D Echocardiogram  EF 50-55% with no source of embolus.   Carotid Doppler  Right There appears to be a dissection in the most distal CCA / Bulb region. Left - ICA is occluded. Bilateral vertebral artery flow is antegrade.   CXR 11/11/2012   No acute cardiopulmonary disease.      EKG  SINUS TACHYCARDIA   Therapy Recommendations no PT, no OT  Physical Exam : frail elderly pleasant Caucasian lady not in distress.Awake alert. Afebrile. Head is nontraumatic. Neck is supple without bruit. Hearing is normal. Cardiac exam no murmur or gallop. Lungs are clear to auscultation. Distal pulses are well felt. Neurological Exam ;  Awake  Alert oriented x 3. Normal speech and language.eye movements full without nystagmus.fundi were not visualized. Vision acuity and fields appear normal. Hearing is normal. Palatal movements are normal. Face symmetric. Tongue midline. Normal strength, tone, reflexes and coordination. Normal sensation. Gait deferred.  IMPRESSION [name] is a 73 y.o. female presenting with syncopal episode and transient right sided weakness in setting of atrial fibrillation with rapid ventricular response. CT head and MRI head confirms a small right thalamic and pontine hemorrhage which is clinically silent and represents remote microhemorrhage from chronic uncontrolled hypertensive microvascular disease, not acute hemorrhage, no acute stroke. Dx: left brain TIA.  On no  Antiplatelets prior to admission; had been on coumadin in the past. Now on eliquis. Patient with no resultant neuro deficits. Stroke work up completed.   Accelerated Hypertension with hypertensive crisis. BP 183/125, off cardene. Stable today 140-160s. Will not add BP meds at this time.  Paroxysmal atrial fibrillation with  rapid ventricular response. Had new episode during the night. Started on Flecainide.  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 Patient was scheduled tid at home, but only took as needed. Changed Tranxene to prn  Hospital day # 3  TREATMENT/PLAN  eliquis for secondary stroke prevention  SBP goal < 180, DBP < 100  Plan discharge pending nuclear scan results  Follow up Dr. Johney Frame 4 weeks  followup Dr. Pearlean Brownie in 2 months.  Dr. Pearlean Brownie discussed diagnosis, prognosis and plan of care  with daughter who was at the bedside at 1113 when he stopped by the room.                                                  Annie Main, MSN, RN, ANVP-BC, ANP-BC, [facility] Pager: [phone] 11/14/2012 11:13 AM  I have personally obtained a history, examined the patient, evaluated imaging results, and formulated the assessment and plan of care. I agree with the above. Delia Heady, MD

## 2012-11-14 NOTE — Progress Notes (Signed)
SUBJECTIVE: The patient is doing well today.  At this time, she denies chest pain, shortness of breath, or any new concerns.  Maintaining SR on Flecainide.  For myoview today.   Marland Kitchen apixaban  2.5 mg Oral BID  . diltiazem  120 mg Oral Daily  . flecainide  50 mg Oral Q12H  . ketorolac  1 drop Right Eye Daily  . labetalol  200 mg Oral BID  . multivitamin-lutein  1 capsule Oral Daily  . pantoprazole  40 mg Oral Q1200  . prednisoLONE acetate  1 drop Right Eye Daily  . regadenoson  0.4 mg Intravenous Once  . senna-docusate  1 tablet Oral BID  . timolol  1 drop Both Eyes BID   . sodium chloride 50 mL/hr at 11/14/12 0529    OBJECTIVE: Physical Exam: Filed Vitals:   11/13/12 1900 11/13/12 2212 11/14/12 0113 11/14/12 0541  BP: 156/99 156/101 151/98 172/108  Pulse: 80 81 83 79  Temp:  98.1 F (36.7 C) 97.8 F (36.6 C) 98.4 F (36.9 C)  TempSrc:  Oral Oral Oral  Resp: 19 18 18 18   Height:      Weight:      SpO2: 99% 99% 95% 97%    Intake/Output Summary (Last 24 hours) at 11/14/12 0745 Last data filed at 11/13/12 1900  Gross per 24 hour  Intake   1560 ml  Output      1 ml  Net   1559 ml    Telemetry reveals sinus rhythm  GEN- The patient is well appearing, alert and oriented x 3 today.   Head- normocephalic, atraumatic Eyes-  Sclera clear, conjunctiva pink Ears- hearing intact Oropharynx- clear Neck- supple, no JVP Lymph- no cervical lymphadenopathy Lungs- Clear to ausculation bilaterally, normal work of breathing Heart- Regular rate and rhythm, no murmurs, rubs or gallops, PMI not laterally displaced GI- soft, NT, ND, + BS Extremities- no clubbing, cyanosis, or edema  LABS: Basic Metabolic Panel:  Recent Labs  16/10/96 0504 11/14/12 0520  NA 141 140  K 3.2* 3.4*  CL 110 109  CO2 19 21  GLUCOSE 103* 95  BUN 25* 29*  CREATININE 1.82* 1.97*  CALCIUM 9.3 9.4  CBC:  Recent Labs  11/14/12 0520  WBC 6.9  HGB 9.7*  HCT 28.6*  MCV 89.4  PLT 192    Thyroid Function Tests:  Recent Labs  11/12/12 0504  TSH 0.918   RADIOLOGY: Dg Chest 1 View  11/11/2012  *RADIOLOGY REPORT*  Clinical Data: Stroke  CHEST - 1 VIEW  Comparison: None.  Findings: The cardiac silhouette is mildly enlarged.  The aorta is tortuous.  No mediastinal or hilar masses.  The lungs are clear. The bony thorax is diffusely demineralized but intact.  IMPRESSION: No acute cardiopulmonary disease.   Original Report Authenticated By: Amie Portland, M.D.    Ct Head Wo Contrast  11/11/2012  *RADIOLOGY REPORT*  Clinical Data: Code stroke, expressive aphasia, hypotension  CT HEAD WITHOUT CONTRAST  Technique:  Contiguous axial images were obtained from the base of the skull through the vertex without contrast.  Comparison: None.  Findings: There is focal hyperdensity in the right thalamus, image 16, measuring 4 mm.  Mild diffuse cortical volume loss noted with proportional ventricular prominence.  Periventricular white matter hypodensity is evident.  No midline shift.  No skull fracture. Orbits and paranasal sinuses are intact.  IMPRESSION: Focal hyperdensity within the right thalamus, less likely acute hemorrhage.  Periventricular white matter hypodensity, which is age  indeterminate but could be anoxic change related to hypotension given the provided clinical history.  Critical Value/emergent results were called by telephone at the time of interpretation on 11/11/2012 at 12:45 a.m. to Dr. Amada Jupiter, who verbally acknowledged these results.   Original Report Authenticated By: Christiana Pellant, M.D.    Mr [facility] Wo Contrast  11/11/2012  *RADIOLOGY REPORT*  Clinical Data:  Code stroke.  Expressive aphasia.  T-PA given.  MRI HEAD WITHOUT CONTRAST MRA HEAD WITHOUT CONTRAST  Technique:  Multiplanar, multiecho pulse sequences of the brain and surrounding structures were obtained without intravenous contrast. Angiographic images of the head were obtained using MRA technique without contrast.   Comparison:  CT head 11/11/2012  MRI HEAD  Findings:  Negative for acute ischemic infarct.  Diffusion weighted imaging shows no area of restricted diffusion.  Small area of susceptibility in the right thalamus.  This was hyperdense on CT.  Based on the MRI, I would favor this is an area of chronic hemorrhage with calcification and iron deposition. Recent hemorrhage not excluded.  There is a small area of chronic hemorrhage in the right ventral pons.  There is moderate to advanced chronic microvascular ischemia in the white matter and brainstem.  No cortical infarct.  Negative for mass lesion.  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  IMPRESSION: Negative for acute ischemic infarct.  Moderate to advanced chronic microvascular ischemia.  Small area of susceptibility in the right pons, favor chronic hemorrhage.  This is hyperdense on CT and recent hemorrhage cannot be completely excluded.  There is also a smaller chronic hemorrhage in the right pons.  MRA HEAD  Findings: Both vertebral arteries are tortuous but patent to the basilar.  The basilar is mildly enlarged but widely patent. Superior cerebellar and posterior cerebral arteries are patent bilaterally.  Right internal carotid artery is tortuous and widely patent.  Right anterior and middle cerebral arteries are widely patent.  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  There is flow in the left anterior and middle cerebral arteries which are supplied via of the anterior communicating  artery and the left posterior communicating artery.  Both posterior communicating arteries are patent.  Negative for aneurysm.  IMPRESSION: Occluded left internal carotid artery.  There is good collateral circulation in the circle of Willis with patent anterior middle cerebral arteries on the left.  Tortuous intracranial vessels suggesting chronic hypertension.   Original Report Authenticated By: Janeece Riggers, M.D.    Mr Brain Wo Contrast  11/11/2012  *RADIOLOGY REPORT*   Clinical Data:  Code stroke.  Expressive aphasia.  T-PA given.  MRI HEAD WITHOUT CONTRAST MRA HEAD WITHOUT CONTRAST  Technique:  Multiplanar, multiecho pulse sequences of the brain and surrounding structures were obtained without intravenous contrast. Angiographic images of the head were obtained using MRA technique without contrast.  Comparison:  CT head 11/11/2012  MRI HEAD  Findings:  Negative for acute ischemic infarct.  Diffusion weighted imaging shows no area of restricted diffusion.  Small area of susceptibility in the right thalamus.  This was hyperdense on CT.  Based on the MRI, I would favor this is an area of chronic hemorrhage with calcification and iron deposition. Recent hemorrhage not excluded.  There is a small area of chronic hemorrhage in the right ventral pons.  There is moderate to advanced chronic microvascular ischemia in the white matter and brainstem.  No cortical infarct.  Negative for mass lesion.  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  IMPRESSION: Negative for acute ischemic  infarct.  Moderate to advanced chronic microvascular ischemia.  Small area of susceptibility in the right pons, favor chronic hemorrhage.  This is hyperdense on CT and recent hemorrhage cannot be completely excluded.  There is also a smaller chronic hemorrhage in the right pons.  MRA HEAD  Findings: Both vertebral arteries are tortuous but patent to the basilar.  The basilar is mildly enlarged but widely patent. Superior cerebellar and posterior cerebral arteries are patent bilaterally.  Right internal carotid artery is tortuous and widely patent.  Right anterior and middle cerebral arteries are widely patent.  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  There is flow in the left anterior and middle cerebral arteries which are supplied via of the anterior communicating  artery and the left posterior communicating artery.  Both posterior communicating arteries are patent.  Negative for aneurysm.  IMPRESSION:  Occluded left internal carotid artery.  There is good collateral circulation in the circle of Willis with patent anterior middle cerebral arteries on the left.  Tortuous intracranial vessels suggesting chronic hypertension.   Original Report Authenticated By: Janeece Riggers, M.D.     ASSESSMENT AND PLAN:  Active Problems:   ICH (intracerebral hemorrhage)   Accelerated hypertension   1. Afib  The patient has paroxysmal atrial fibrillation documented. She is maintaining sinus with flecainide 50mg  bid.  She is also on low dose cardizem for rate control.   2. HTN  She likely had a hypertensive crisis which lead to her hospitalization. In the short term, I will defer BP control to the stroke service.  She will need close outpatient BP follow-up  Given PVD, will proceed with lexiscan myoview to evaluate for CAD. IF normal then we can use flecainide long term. If abnormal then we will need to consider sotalol or amiodarone as alternatives.  If low risk myoview, then she could be discharged later today from my standpoint. I am happy to see her in follow-up in my office in 4 weeks (though she lives in [place] and may prefer to have a doctor closer to home).  Call with questions   Hillis Range, MD 11/14/2012 7:45 AM

## 2012-11-15 DIAGNOSIS — E785 Hyperlipidemia, unspecified: Secondary | ICD-10-CM

## 2012-11-15 DIAGNOSIS — I739 Peripheral vascular disease, unspecified: Secondary | ICD-10-CM

## 2012-11-15 DIAGNOSIS — I480 Paroxysmal atrial fibrillation: Secondary | ICD-10-CM

## 2012-11-15 DIAGNOSIS — I639 Cerebral infarction, unspecified: Secondary | ICD-10-CM

## 2012-11-15 DIAGNOSIS — I779 Disorder of arteries and arterioles, unspecified: Secondary | ICD-10-CM

## 2012-11-15 DIAGNOSIS — R55 Syncope and collapse: Secondary | ICD-10-CM

## 2012-11-15 DIAGNOSIS — I614 Nontraumatic intracerebral hemorrhage in cerebellum: Secondary | ICD-10-CM

## 2012-11-15 DIAGNOSIS — Z79899 Other long term (current) drug therapy: Secondary | ICD-10-CM

## 2012-11-15 MED ORDER — FLECAINIDE ACETATE 50 MG PO TABS: 50.0000 mg | ORAL_TABLET | Freq: Two times a day (BID) | ORAL | Status: DC

## 2012-11-15 MED ORDER — ATORVASTATIN CALCIUM 10 MG PO TABS: 10.0000 mg | ORAL_TABLET | Freq: Every day | ORAL | Status: DC

## 2012-11-15 MED ORDER — APIXABAN 2.5 MG PO TABS: 2.5000 mg | ORAL_TABLET | Freq: Two times a day (BID) | ORAL | Status: DC

## 2012-11-15 MED ORDER — DILTIAZEM HCL ER COATED BEADS 120 MG PO CP24: 120.0000 mg | ORAL_CAPSULE | Freq: Every day | ORAL | Status: DC

## 2012-11-15 MED ORDER — APIXABAN 2.5 MG PO TABS
2.5000 mg | ORAL_TABLET | Freq: Two times a day (BID) | ORAL | Status: DC
  Administered 2012-11-15: 2.5 mg via ORAL
  Filled 2012-11-15 (×2): qty 1

## 2012-11-15 MED ORDER — ATORVASTATIN CALCIUM 10 MG PO TABS
10.0000 mg | ORAL_TABLET | Freq: Every day | ORAL | Status: DC
  Filled 2012-11-15: qty 1

## 2012-11-15 NOTE — Discharge Summary (Signed)
Stroke Discharge Summary  Patient ID: Heyli Min   MRN: [id number]      DOB: 09-09-1940  Date of Admission: 11/11/2012 Date of Discharge: 11/15/2012  Attending Physician:  Darcella Cheshire, MD, Stroke MD  Consulting Physician(s):   Treatment Team:  Md Stroke, MD [facility], MD     Discharge Diagnoses:  Principal Problem: Left hemispheric TIA from atrial fibrillation in setting of syncopal episode from new onset paroxysmal atrial fibrillation with rapid  Ventricular response    Remote age left subcortical small microhemorrhage related to accelerated HT Active Problems:   Accelerated hypertension   PAF (paroxysmal atrial fibrillation)   PVD (peripheral vascular disease)   Bilateral carotid artery disease   Encounter for long-term (current) use of high-risk medication   Other and unspecified hyperlipidemia  BMI: Body mass index is 18.8 kg/(m^2).  Past Medical History  Diagnosis Date  . Hypertension   . Cervical cancer   . Breast cancer   . Peripheral vascular disease   . Renal artery stenosis   . Chronic renal insufficiency    Past Surgical History  Procedure Laterality Date  . Repair of perforated ulcer N/A     ulceration * 2 of the esophagus  . Thromboendarterectomy    . 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 Eye surgery Right cataract repair      Medication List    STOP taking these medications       ibuprofen 200 MG tablet  Commonly known as:  ADVIL,MOTRIN     PRESERVISION/LUTEIN PO      TAKE these medications       apixaban 2.5 MG Tabs tablet  Commonly known as:  ELIQUIS  Take 1 tablet (2.5 mg total) by mouth 2 (two) times daily.     atorvastatin 10 MG tablet  Commonly known as:  LIPITOR  Take 1 tablet (10 mg total) by mouth daily at 6 PM.     clorazepate 7.5 MG tablet  Commonly known as:  TRANXENE  Take 7.5 mg by mouth 3 (three) times daily.     diltiazem 120 MG 24 hr capsule  Commonly known as:  CARDIZEM CD  Take  1 capsule (120 mg total) by mouth daily.     flecainide 50 MG tablet  Commonly known as:  TAMBOCOR  Take 1 tablet (50 mg total) by mouth every 12 (twelve) hours.     ketorolac 0.5 % ophthalmic solution  Commonly known as:  ACULAR  Place 1 drop into the right eye daily.     labetalol 200 MG tablet  Commonly known as:  NORMODYNE  Take 200 mg by mouth 2 (two) times daily.     prednisoLONE acetate 1 % ophthalmic suspension  Commonly known as:  PRED FORTE  Place 1 drop into the right eye daily.     timolol 0.25 % ophthalmic solution  Commonly known as:  BETIMOL  Place 1 drop into both eyes 2 (two) times daily.        LABORATORY STUDIES CBC    Component Value Date/Time   WBC 6.9 11/14/2012 0520   RBC 3.20* 11/14/2012 0520   HGB 9.7* 11/14/2012 0520   HCT 28.6* 11/14/2012 0520   PLT 192 11/14/2012 0520   MCV 89.4 11/14/2012 0520   MCH 30.3 11/14/2012 0520   MCHC 33.9 11/14/2012 0520   RDW 14.3 11/14/2012 0520   LYMPHSABS 1.6 11/11/2012 0047   MONOABS 0.7 11/11/2012 0047   EOSABS  0.2 11/11/2012 0047   BASOSABS 0.1 11/11/2012 0047   CMP    Component Value Date/Time   NA 140 11/14/2012 0520   K 3.4* 11/14/2012 0520   CL 109 11/14/2012 0520   CO2 21 11/14/2012 0520   GLUCOSE 95 11/14/2012 0520   BUN 29* 11/14/2012 0520   CREATININE 1.97* 11/14/2012 0520   CALCIUM 9.4 11/14/2012 0520   PROT 6.8 11/11/2012 0047   ALBUMIN 3.6 11/11/2012 0047   AST 17 11/11/2012 0047   ALT 9 11/11/2012 0047   ALKPHOS 75 11/11/2012 0047   BILITOT 0.4 11/11/2012 0047   GFRNONAA 25* 11/14/2012 0520   GFRAA 28* 11/14/2012 0520   COAGS Lab Results  Component Value Date   INR 1.10 11/11/2012   Lipid Panel    Component Value Date/Time   CHOL 213* 11/11/2012 0631   TRIG 154* 11/11/2012 0631   HDL 48 11/11/2012 0631   CHOLHDL 4.4 11/11/2012 0631   VLDL 31 11/11/2012 0631   LDLCALC 134* 11/11/2012 0631   HgbA1C  Lab Results  Component Value Date   HGBA1C 5.7* 11/11/2012   Cardiac Panel (last 3 results) No  results found for this basename: CKTOTAL, CKMB, TROPONINI, RELINDX,  in the last 72 hours Urinalysis    Component Value Date/Time   COLORURINE YELLOW 11/11/2012 0814   APPEARANCEUR CLEAR 11/11/2012 0814   LABSPEC 1.013 11/11/2012 0814   PHURINE 5.5 11/11/2012 0814   GLUCOSEU NEGATIVE 11/11/2012 0814   HGBUR NEGATIVE 11/11/2012 0814   BILIRUBINUR NEGATIVE 11/11/2012 0814   KETONESUR NEGATIVE 11/11/2012 0814   PROTEINUR 100* 11/11/2012 0814   UROBILINOGEN 0.2 11/11/2012 0814   NITRITE NEGATIVE 11/11/2012 0814   LEUKOCYTESUR TRACE* 11/11/2012 0814   Urine Drug Screen  No results found for this basename: labopia,  cocainscrnur,  labbenz,  amphetmu,  thcu,  labbarb    Alcohol Level No results found for this basename: eth    SIGNIFICANT DIAGNOSTIC STUDIES  CT Head 11/11/2012 Focal hyperdensity within the right thalamus, less likely acute hemorrhage. Periventricular white matter hypodensity, which is age indeterminate but could be anoxic change related to hypotension given the provided clinical history.  MRI of the brain 11/11/2012 Negative for acute ischemic infarct. Moderate to advanced chronic microvascular ischemia. Small area of susceptibility in the right pons, favor chronic hemorrhage. This is hyperdense on CT and recent hemorrhage cannot be completely excluded. There is also a smaller chronic hemorrhage in the right pons.  MRA of the brain 11/11/2012 Occluded left internal carotid artery. There is good collateral circulation in the circle of Willis with patent anterior middle cerebral arteries on the left. Tortuous intracranial vessels suggesting chronic hypertension.  2D Echocardiogram EF 50-55% with no source of embolus.  Carotid Doppler Right There appears to be a dissection in the most distal CCA / Bulb region. Left - ICA is occluded. Bilateral vertebral artery flow is antegrade.  CXR 11/11/2012 No acute cardiopulmonary disease.  EKG SINUS TACHYCARDIA      History of Present Illness      Shelly Spenser is a 72 y.o. female a history of severe hypertension who earlier tonight around 11:15 PM walked into the kitchen, called out her daughter's name and then became unresponsive. When EMS arrival, she was moving her left side but not her right and was nonverbal. En route, she began having some speech and began moving her right side well. On arrival to the ER, she remained aphasic but was able. Per EMS, in the field they observed afib, but  has been in sinus rhythm here. Patient was not a TPA candidate secondary to Ct showing small left frontal periventricular hemorrhage     . She was admitted to the neuro ICU for further evaluation and treatment   Hospital Course    Ms. Viktoriya Glaspy is a 72 y.o. female presenting with syncopal episode and transient right sided weakness in setting of atrial fibrillation with rapid ventricular response. CT head and MRI head confirms a small right thalamic and pontine hemorrhage which is clinically silent and represents remote microhemorrhage from chronic uncontrolled hypertensive microvascular disease, not acute hemorrhage, no acute stroke. Dx: left brain TIA. On no Antiplatelets prior to admission; had been on coumadin in the past. Now on eliquis. Patient with no resultant neuro deficits. Stroke work up completed.  Accelerated Hypertension with hypertensive crisis. BP 183/125, off cardene. Stable today 140-160s. Will not add BP meds at this time.  Paroxysmal atrial fibrillation with rapid ventricular response. Had new episode during the night. Started on Flecainide.  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 Patient was scheduled tid at home, but only took as needed, continue same at home.   Physical therapy, occupational  therapy and speech therapy evaluated patient. They recommend no further physical therapy.  Discharge Exam  Blood pressure 165/99, pulse 79, temperature 98.7 F (37.1 C), temperature source Oral, resp. rate 18, height 5\' 4"  (1.626 m), weight 49.7 kg (109 lb 9.1 oz), SpO2 99.00%.   Physical Exam : frail elderly pleasant Caucasian lady not in distress.Awake alert. Afebrile. Head is nontraumatic. Neck is supple without bruit. Hearing is normal. Cardiac exam no murmur or gallop. Lungs are clear to auscultation. Distal pulses are well felt.  Neurological Exam ;  Awake Alert oriented x 3. Normal speech and language.eye movements full without nystagmus.fundi were not visualized. Vision acuity and fields appear normal. Hearing is normal. Palatal movements are normal. Face symmetric. Tongue midline. Normal strength, tone, reflexes and coordination. Normal sensation. Gait deferred.   Discharge Diet   Cardiac thin liquids  Discharge Plan    Disposition:  home   Eliquis for secondary stroke prevention.  Ongoing risk factor control by Primary Care Physician. Risk factor recommendations:  Hypertension target range 130-140/70-80 Lipid goal LDL < 100; add statin. Diabetes - HgB A1C <7  Follow-up a primary physician in 1 month.  Follow-up with Dr. Johney Frame within 2 weeks.  Follow-up with Dr. Delia Heady in 2 months.  60 minutes were spent preparing discharge.  Signed Gwendolyn Lima. Manson Passey, [facility], MBA, MHA [facility] Pager: [phone] 11/15/2012 2:37 PM  I have personally examined this patient, reviewed pertinent data and developed the plan of care. I agree with above. Delia Heady, MD

## 2012-11-15 NOTE — Progress Notes (Signed)
Discharge instructions given and reviewed with patient and sister. Patient discharged home with sister.

## 2012-11-15 NOTE — Progress Notes (Signed)
Stroke Team Progress Note  HISTORY Dana Carr is a 72 y.o. female a history of severe hypertension who earlier tonight around 11:15 PM walked into the kitchen, called out her daughter's name and then became unresponsive. When EMS arrival, she was moving her left side but not her right and was nonverbal. En route, she began having some speech and began moving her right side well. On arrival to the ER, she remained aphasic but was able. Per EMS, in the field they observed afib, but has been in sinus rhythm here. Patient was not a TPA candidate secondary to Ct showing hemorrhage and rapid resolution . She was admitted to the neuro ICU for further evaluation and treatment.  SUBJECTIVE  Patient in room with family. Ready to go home. Symptoms improved, no worsening.   OBJECTIVE Most recent Vital Signs: Filed Vitals:   11/15/12 0147 11/15/12 0215 11/15/12 0539 11/15/12 0935  BP: 169/108 150/101 169/102 165/99  Pulse: 75  81 79  Temp: 98 F (36.7 C)  98.2 F (36.8 C) 98.7 F (37.1 C)  TempSrc: Oral  Oral Oral  Resp: 18  18 18   Height:      Weight:      SpO2: 94%  95% 99%   CBG (last 3)   Recent Labs  11/13/12 2222 11/14/12 0655 11/14/12 1627  GLUCAP 105* 91 112*   IV Fluid Intake:   . sodium chloride 50 mL/hr at 11/15/12 0104   MEDICATIONS  . diltiazem  120 mg Oral Daily  . flecainide  50 mg Oral Q12H  . ketorolac  1 drop Right Eye Daily  . labetalol  200 mg Oral BID  . multivitamin-lutein  1 capsule Oral Daily  . pantoprazole  40 mg Oral Q1200  . prednisoLONE acetate  1 drop Right Eye Daily  . senna-docusate  1 tablet Oral BID  . timolol  1 drop Both Eyes BID   PRN:  acetaminophen, acetaminophen, calcium carbonate, clorazepate, labetalol, ondansetron (ZOFRAN) IV, sodium chloride, traMADol  Diet:  Cardiac   Activity: OOB DVT Prophylaxis:  SCDs CLINICALLY SIGNIFICANT STUDIES Basic Metabolic Panel:   Recent Labs Lab 11/12/12 0504 11/14/12 0520  NA 141 140  K 3.2*  3.4*  CL 110 109  CO2 19 21  GLUCOSE 103* 95  BUN 25* 29*  CREATININE 1.82* 1.97*  CALCIUM 9.3 9.4   Liver Function Tests:   Recent Labs Lab 11/11/12 0047  AST 17  ALT 9  ALKPHOS 75  BILITOT 0.4  PROT 6.8  ALBUMIN 3.6   CBC:   Recent Labs Lab 11/11/12 0047 11/14/12 0520  WBC 6.5 6.9  NEUTROABS 3.9  --   HGB 10.5* 9.7*  HCT 30.7* 28.6*  MCV 90.0 89.4  PLT 177 192   Coagulation:   Recent Labs Lab 11/11/12 0047  LABPROT 14.1  INR 1.10   Cardiac Enzymes:   Recent Labs Lab 11/11/12 0048  TROPONINI <0.30   Urinalysis:   Recent Labs Lab 11/11/12 0814  COLORURINE YELLOW  LABSPEC 1.013  PHURINE 5.5  GLUCOSEU NEGATIVE  HGBUR NEGATIVE  BILIRUBINUR NEGATIVE  KETONESUR NEGATIVE  PROTEINUR 100*  UROBILINOGEN 0.2  NITRITE NEGATIVE  LEUKOCYTESUR TRACE*   Lipid Panel     Component Value Date/Time   CHOL 213* 11/11/2012 0631   TRIG 154* 11/11/2012 0631   HDL 48 11/11/2012 0631   CHOLHDL 4.4 11/11/2012 0631   VLDL 31 11/11/2012 0631   LDLCALC 134* 11/11/2012 0631   HgbA1C  Lab Results  Component Value  Date   HGBA1C 5.7* 11/11/2012    Urine Drug Screen:   No results found for this basename: labopia,  cocainscrnur,  labbenz,  amphetmu,  thcu,  labbarb    Alcohol Level: No results found for this basename: ETH,  in the last 168 hours  CT Head 11/11/2012  Focal hyperdensity within the right thalamus, less likely acute hemorrhage.  Periventricular white matter hypodensity, which is age indeterminate but could be anoxic change related to hypotension given the provided clinical history.    MRI of the brain  11/11/2012  Negative for acute ischemic infarct.  Moderate to advanced chronic microvascular ischemia.  Small area of susceptibility in the right pons, favor chronic hemorrhage.  This is hyperdense on CT and recent hemorrhage cannot be completely excluded.  There is also a smaller chronic hemorrhage in the right pons.    MRA of the brain  11/11/2012  Occluded  left internal carotid artery.  There is good collateral circulation in the circle of Willis with patent anterior middle cerebral arteries on the left.  Tortuous intracranial vessels suggesting chronic hypertension.    2D Echocardiogram  EF 50-55% with no source of embolus.   Carotid Doppler  Right There appears to be a dissection in the most distal CCA / Bulb region. Left - ICA is occluded. Bilateral vertebral artery flow is antegrade.   CXR 11/11/2012   No acute cardiopulmonary disease.      EKG  SINUS TACHYCARDIA   Therapy Recommendations no PT, no OT  Cardiac nuclear scan:  11/13/2012 Dilated left ventricle with global hypokinesis; the calculated left ventricular ejection fraction is 35%.  2. Inferoapical perfusion defect appears fixed and most consistent with scar. No definite pharmacologically induced myocardial ischemia.   Physical Exam : frail elderly pleasant Caucasian lady not in distress.Awake alert. Afebrile. Head is nontraumatic. Neck is supple without bruit. Hearing is normal. Cardiac exam no murmur or gallop. Lungs are clear to auscultation. Distal pulses are well felt. Neurological Exam ;  Awake  Alert oriented x 3. Normal speech and language.eye movements full without nystagmus.fundi were not visualized. Vision acuity and fields appear normal. Hearing is normal. Palatal movements are normal. Face symmetric. Tongue midline. Normal strength, tone, reflexes and coordination. Normal sensation. Gait deferred.  IMPRESSION [name] is a 72 y.o. female presenting with syncopal episode and transient right sided weakness in setting of atrial fibrillation with rapid ventricular response. CT head and MRI head confirms a small right thalamic and pontine hemorrhage which is clinically silent and represents remote microhemorrhage from chronic uncontrolled hypertensive microvascular disease, not acute hemorrhage, no acute stroke. Dx: left brain TIA.  On no  Antiplatelets prior to  admission; had been on coumadin in the past. Now on eliquis. Patient with no resultant neuro deficits. Stroke work up completed.   Accelerated Hypertension with hypertensive crisis. BP 183/125, off cardene. Blood pressures trending downward. Will not add BP meds at this time due to alleries.  Paroxysmal atrial fibrillation with rapid ventricular response. Had new episode during the night. Started on Flecainide.  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 Patient was scheduled tid at home, but only took as needed. Changed Tranxene to prn  Hospital day # 4  TREATMENT/PLAN  eliquis for secondary stroke prevention  SBP goal < 180, DBP < 100  Plan discharge today. Pt wants to go home to sisters to live. She has told us that she does not want home health.  Follow up Dr. Johney Frame 4 weeks for follow up care and blood pressure management. See his note re: review of nuclear test and echo.  followup Dr. Pearlean Brownie in 2 months.  D/w Dr Johney Frame and Excell Seltzer  Dr. Pearlean Brownie discussed diagnosis, prognosis and plan of care with daughter who was at the bedside when he stopped by the room.                                                  Gwendolyn Lima. Manson Passey, [facility], MBA, MHA [facility] Pager: [phone] 11/15/2012 10:24 AM  I have personally obtained a history, examined the patient, evaluated imaging results, and formulated the assessment and plan of care. I agree with the above.  Delia Heady, MD

## 2012-11-15 NOTE — Progress Notes (Addendum)
SUBJECTIVE: The patient is doing well today.  At this time, she denies chest pain, shortness of breath, or any new concerns.  Maintaining SR on Flecainide.  Myoview yesterday demonstrated an EF of 35% with global hypokinesis.  Inferoapical perfusion defect fixed and most consistent with scar.  No ischemia.  This is not consistent with echo which revealed EF 55% with no wall motion abnormalities.  Marland Kitchen apixaban  2.5 mg Oral BID  . diltiazem  120 mg Oral Daily  . flecainide  50 mg Oral Q12H  . ketorolac  1 drop Right Eye Daily  . labetalol  200 mg Oral BID  . multivitamin-lutein  1 capsule Oral Daily  . pantoprazole  40 mg Oral Q1200  . prednisoLONE acetate  1 drop Right Eye Daily  . senna-docusate  1 tablet Oral BID  . timolol  1 drop Both Eyes BID   . sodium chloride 50 mL/hr at 11/15/12 0104    OBJECTIVE: Physical Exam: Filed Vitals:   11/14/12 1821 11/14/12 2136 11/15/12 0147 11/15/12 0215  BP: 165/110 176/106 169/108 150/101  Pulse: 82 79 75   Temp: 98.3 F (36.8 C) 98 F (36.7 C) 98 F (36.7 C)   TempSrc: Oral Oral Oral   Resp: 16 16 18    Height:      Weight:      SpO2: 99% 98% 94%    No intake or output data in the 24 hours ending 11/15/12 0622  Telemetry reveals sinus rhythm  GEN- The patient is well appearing, alert and oriented x 3 today.   Head- normocephalic, atraumatic Eyes-  Sclera clear, conjunctiva pink Ears- hearing intact Oropharynx- clear Neck- supple, no JVP Lymph- no cervical lymphadenopathy Lungs- Clear to ausculation bilaterally, normal work of breathing Heart- Regular rate and rhythm, no murmurs, rubs or gallops, PMI not laterally displaced GI- soft, NT, ND, + BS Extremities- no clubbing, cyanosis, or edema  LABS: Basic Metabolic Panel:  Recent Labs  16/10/96 0520  NA 140  K 3.4*  CL 109  CO2 21  GLUCOSE 95  BUN 29*  CREATININE 1.97*  CALCIUM 9.4   CBC:  Recent Labs  11/14/12 0520  WBC 6.9  HGB 9.7*  HCT 28.6*  MCV 89.4    PLT 192   RADIOLOGY: Nm Myocar Multi W/spect W/wall Motion / Ef 11/14/2012  *RADIOLOGY REPORT*  Clinical Data:  Chest pain.  History of syncope, atrial fibrillation and hypertension.  MYOCARDIAL IMAGING WITH SPECT (REST AND PHARMACOLOGIC-STRESS) GATED LEFT VENTRICULAR WALL MOTION STUDY LEFT VENTRICULAR EJECTION FRACTION  Technique:  Resting myocardial SPECT imaging was initially performed after intravenous administration of radiopharmaceutical. Myocardial SPECT was subsequently performed after additional radiopharmaceutical injection during pharmacologic-stress (Lexiscan)supervised by the Cardiology staff.  Quantitative gated imaging was also performed to evaluate left ventricular wall motion, and estimate left ventricular ejection fraction.  Radiopharmaceutical:  10 mCi Tc-51m sestamibi at rest and 30 mCi during stress.  Comparison: Portable chest 11/11/2012.  Findings:  SPECT images demonstrate dilatation of the left ventricle with a fixed apical perfusion defect.  Although the computer assisted analysis suggests the possibility of some reversibility in the inferior wall, this is not substantiated by the SPECT images.  There are no definite reversible components.  Gated cine images were reviewed on the workstation and demonstrate global hypokinesis without focal wall motion abnormality.  The QGS ejection fraction measured at rest is 35% with an end diastolic volume of 115 ml and an end-systolic volume of 75 ml.  IMPRESSION:  1.  Dilated left ventricle with global hypokinesis; the calculated left ventricular ejection fraction is 35%. 2.  Inferoapical perfusion defect appears fixed and most consistent with scar.  No definite pharmacologically induced myocardial ischemia.   Original Report Authenticated By: Carey Bullocks, M.D.     ASSESSMENT AND PLAN:  Active Problems:   ICH (intracerebral hemorrhage)   Accelerated hypertension  1. Afib  The patient has paroxysmal atrial fibrillation documented. She is  maintaining sinus with flecainide 50mg  bid. She is also on low dose cardizem for rate control.   2. HTN  She likely had a hypertensive crisis which lead to her hospitalization. In the short term, I will defer BP control to the stroke service. She will need close outpatient BP follow-up   There is discrepancy between her echo and myoview.  I have spoken with Dr Excell Seltzer who will review the echo this am.  IF she truly has no wall motion abnormalities, then I would favor no further CV workup. If however, she has wall motion changes by echo which correspond to her myoview then I would anticipate that we may need to proceed with cath tomorrow. Will make a decision later today. Hold eliquis in case we proceed with cath. If we decide to not proceed with cath then she could potentially go home later today.   Addendum: Echo reviewed and patient discussed with Dr Excell Seltzer. Reveals EF 50-55% with no scar. Give near normal appearing ventricular, no ischemic symptoms, and no ischemia on myoview, will plan medical therapy without any further CV risk stratification at this time.  Resume eliquis at discharge. OK to discharge today from CV standpoint BP management per primary team.  Call with questions.

## 2012-11-15 NOTE — Care Management Note (Signed)
    Page 1 of 1   11/15/2012     12:11:21 PM   CARE MANAGEMENT NOTE 11/15/2012  Patient:  Dana Carr,Dana Carr   Account Number:  [id number]  Date Initiated:  11/14/2012  Documentation initiated by:  [facility]  Subjective/Objective Assessment:   ICH     Action/Plan:   HHPT rec with 24 hour Supervision   Anticipated DC Date:  11/17/2012   Anticipated DC Plan:  HOME W HOME HEALTH SERVICES      DC Planning Services  CM consult      Choice offered to / List presented to:          [facility] arranged  HH - 11 Patient Refused      Status of service:  Completed, signed off Medicare Important Message given?   (If response is "NO", the following Medicare IM given date fields will be blank) Date Medicare IM given:   Date Additional Medicare IM given:    Discharge Disposition:  HOME/SELF CARE  Per UR Regulation:    If discussed at Long Length of Stay Meetings, dates discussed:    Comments:  11/14/2012 1615 NCM spoke to pt and offered choice for [facility]. States she is living here in [place] with sister, Selmer Dominion [phone]. Gave permission to speak to sister. Pt is refusing HH PT. States her blood pressure runs high but she can manage at home. NCM recommended HH RN to follow up post dc. Pt states she checks her blood pressure at home and takes meds as prescribed. States she is able to ambulate without any DME. She is refusing HH RN and PT at this time. Contacted sister and states she is going to be with pt 24 hours the first few days she is home. And have adequate support system to ensure her safety at home. Explained to pt and sister if they decided [facility] is needed that her PCP can order the [facility] from the office. Explained [facility] goes to both [facility] and [employer]. Pt states she will stay with her sister for a few months and then go back to [facility]. Her son, Carollyn Etcheverry lives with her at home in [place]. Provided pt with [facility] list for [facility]. Isidoro Donning RN CCM Case  Mgmt phone [phone]

## 2012-11-16 ENCOUNTER — Telehealth: Payer: Self-pay | Admitting: Neurology

## 2012-11-16 LAB — GLUCOSE, CAPILLARY

## 2012-11-19 NOTE — Telephone Encounter (Signed)
Note printed and given to scheduling department.

## 2012-11-27 DIAGNOSIS — K219 Gastro-esophageal reflux disease without esophagitis: Secondary | ICD-10-CM | POA: Insufficient documentation

## 2012-11-27 DIAGNOSIS — G459 Transient cerebral ischemic attack, unspecified: Secondary | ICD-10-CM | POA: Insufficient documentation

## 2012-11-28 ENCOUNTER — Ambulatory Visit (INDEPENDENT_AMBULATORY_CARE_PROVIDER_SITE_OTHER): Payer: Medicare Other | Admitting: Internal Medicine

## 2012-11-28 VITALS — BP 146/98 | HR 74 | Ht 63.0 in | Wt 118.0 lb

## 2012-11-28 DIAGNOSIS — I4891 Unspecified atrial fibrillation: Secondary | ICD-10-CM

## 2012-11-28 DIAGNOSIS — I10 Essential (primary) hypertension: Secondary | ICD-10-CM

## 2012-11-28 DIAGNOSIS — I480 Paroxysmal atrial fibrillation: Secondary | ICD-10-CM

## 2012-11-28 MED ORDER — FLECAINIDE ACETATE 50 MG PO TABS: ORAL_TABLET | ORAL | Status: DC

## 2012-11-28 MED ORDER — FLECAINIDE ACETATE 100 MG PO TABS: ORAL_TABLET | ORAL | Status: DC

## 2012-11-28 NOTE — Progress Notes (Signed)
skf Patient Care Team: Pcp Not In System as PCP - General   HPI  Dana Carr is a 72 y.o. female Is seen in followup for Dr. Johney Frame for atrial fibrillation. She has multiple thromboembolic risk factors are also had a recent intracranial hemorrhage and was started on apixaban as well as flecainide.  Echo reviewed and patient discussed with Dr Excell Seltzer.  Reveals EF 50-55% with no scar.  Give near normal appearing ventricular, no ischemic symptoms, and no ischemia on myoview, will plan medical therapy without any further CV risk stratification at this time.  She was to be seen shortly after flecainide initiation for reassessment. If Dr. Johney Frame has been scheduled she is here to see me today. EKG done yesterday demonstrated a QRS duration of 112 ms compared to 100 ms prior to initiation. She has had one intercurrent episode of atrial fibrillation.  I should also note that her blood pressures are much better on her current regime.   Past Medical History  Diagnosis Date  . Hypertension   . Cervical cancer   . Breast cancer   . Peripheral vascular disease   . Renal artery stenosis   . Chronic renal insufficiency     Past Surgical History  Procedure Laterality Date  . Repair of perforated ulcer N/A     ulceration * 2 of the esophagus  . Thromboendarterectomy    . 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 Eye surgery Right cataract repair    Current Outpatient Prescriptions  Medication Sig Dispense Refill  . acetaminophen (TYLENOL) 500 MG tablet 500 mg. Take 500 mg by mouth as needed.      Marland Kitchen apixaban (ELIQUIS) 2.5 MG TABS tablet Take 1 tablet (2.5 mg total) by mouth 2 (two) times daily.  60 tablet  2  . apixaban (ELIQUIS) 2.5 MG TABS tablet 2.5 mg. Take 2.5 mg by mouth 2 times daily.      . clorazepate (TRANXENE) 7.5 MG tablet Take 7.5 mg by mouth 3 (three) times daily.      . clorazepate (TRANXENE) 7.5 MG tablet 7.5 mg. Take 7.5 mg by mouth 3 (three) times daily as  needed.      . diltiazem (CARDIZEM CD) 120 MG 24 hr capsule Take 1 capsule (120 mg total) by mouth daily.  30 capsule  2  . diltiazem (CARDIZEM CD) 240 MG 24 hr capsule 120 mg. Take 120 mg by mouth daily.      . dorzolamide-timolol (COSOPT) 22.3-6.8 MG/ML ophthalmic solution 1 drop. Place 1 drop into both eyes 2 times daily.      Marland Kitchen esomeprazole (NEXIUM) 40 MG capsule 40 mg. Take 40 mg by mouth daily.      . flecainide (TAMBOCOR) 50 MG tablet Take 1 tablet (50 mg total) by mouth every 12 (twelve) hours.  30 tablet  0  . flecainide (TAMBOCOR) 50 MG tablet 50 mg. Take 50 mg by mouth 2 times daily.      Marland Kitchen ketorolac (ACULAR) 0.5 % ophthalmic solution Place 1 drop into the right eye daily.      Marland Kitchen labetalol (NORMODYNE) 200 MG tablet Take 200 mg by mouth 2 (two) times daily.      Marland Kitchen labetalol (NORMODYNE) 200 MG tablet 200 mg. Take 200 mg by mouth 2 times daily.      . Lactase (LACTAID PO) Take by mouth as needed.      . methylcellulose (ARTIFICIAL TEARS) 1 % ophthalmic solution 1 drop. Place 1 drop  into both eyes as needed.      . Multiple Vitamins-Minerals (OCUVITE-LUTEIN PO) 2 capsules. Take 2 capsules by mouth daily.      Marland Kitchen NEXIUM 40 MG capsule       . prednisoLONE acetate (PRED FORTE) 1 % ophthalmic suspension Place 1 drop into the right eye daily.      . sodium chloride (V-R NASAL SPRAY SALINE) 0.65 % nasal spray 1 spray. 1 spray by Nasal route as needed.      . timolol (BETIMOL) 0.25 % ophthalmic solution Place 1 drop into both eyes 2 (two) times daily.       No current facility-administered medications for this visit.    Allergies  Allergen Reactions  . Haldol (Haloperidol) Other (See Comments)    unknown  . Tape Other (See Comments)    Skin irritation  . Vasotec (Enalapril) Other (See Comments)    Throat irritation-lost voice    Review of Systems negative except from HPI and PMH  Physical Exam BP 146/98  Pulse 74  Ht 5\' 3"  (1.6 m)  Wt 118 lb (53.524 kg)  BMI 20.91 kg/m2   Well  developed and nourished in no acute distress HENT normal Neck supple with JVP-flat Clear Regular rate and rhythm, no murmurs or gallops Abd-soft with active BS No Clubbing cyanosis edema Skin-warm and dry A & Oriented  Grossly normal sensory and motor function   ECG from [facility] hospital yesterday demonstrated sinus rhythm at 69 Intervals 18/11/46  Assessment and  Plan

## 2012-11-28 NOTE — Assessment & Plan Note (Signed)
Much improved control

## 2012-11-28 NOTE — Patient Instructions (Addendum)
Your physician recommends that you schedule a follow-up appointment in: 4-5 weeks with Dr Johney Frame  Your physician has recommended you make the following change in your medication:  1) Continue Flecainide 50mg  twice daily

## 2012-11-28 NOTE — Assessment & Plan Note (Addendum)
Atrial fibrillation seems to be pre-well controlled on flecainide. There was one intercurrent breakthroughs, hence, I will leave her on her current dosing. QRS duration is within appropriate limits. We'll plan to have a reassessment in about 4 weeks' time.  We also discussed the role of apixaban versus warfarin

## 2012-12-26 ENCOUNTER — Ambulatory Visit (INDEPENDENT_AMBULATORY_CARE_PROVIDER_SITE_OTHER): Payer: Medicare Other | Admitting: Internal Medicine

## 2012-12-26 ENCOUNTER — Encounter: Payer: Self-pay | Admitting: Internal Medicine

## 2012-12-26 VITALS — BP 148/98 | HR 69 | Ht 63.0 in | Wt 117.0 lb

## 2012-12-26 DIAGNOSIS — I4891 Unspecified atrial fibrillation: Secondary | ICD-10-CM

## 2012-12-26 DIAGNOSIS — I10 Essential (primary) hypertension: Secondary | ICD-10-CM

## 2012-12-26 DIAGNOSIS — I480 Paroxysmal atrial fibrillation: Secondary | ICD-10-CM

## 2012-12-26 NOTE — Patient Instructions (Addendum)
Your physician recommends that you schedule a follow-up appointment as needed.   Your physician recommends that you continue on your current medications as directed. Please refer to the Current Medication list given to you today.  

## 2012-12-26 NOTE — Progress Notes (Signed)
Dana Carr is a 72 y.o. female who presents today for routine electrophysiology followup.  Since her hospital discharge, the patient reports doing very well.  Her afib has been well controlled.   Today, she denies symptoms of palpitations, chest pain, shortness of breath,  lower extremity edema, dizziness, presyncope, or syncope.  The patient is otherwise without complaint today.   Past Medical History  Diagnosis Date  . Hypertension   . Cervical cancer   . Breast cancer   . Peripheral vascular disease   . Renal artery stenosis   . Chronic renal insufficiency    Past Surgical History  Procedure Laterality Date  . Repair of perforated ulcer N/A     ulceration * 2 of the esophagus  . Thromboendarterectomy    . 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 Eye surgery Right cataract repair    Current Outpatient Prescriptions  Medication Sig Dispense Refill  . acetaminophen (TYLENOL) 500 MG tablet 500 mg. Take 500 mg by mouth as needed.      Marland Kitchen apixaban (ELIQUIS) 2.5 MG TABS tablet 2.5 mg. Take 2.5 mg by mouth 2 times daily.      . clorazepate (TRANXENE) 7.5 MG tablet 7.5 mg. Take 7.5 mg by mouth 3 (three) times daily as needed.      . diltiazem (CARDIZEM CD) 120 MG 24 hr capsule Take 120 mg by mouth daily.      . dorzolamide-timolol (COSOPT) 22.3-6.8 MG/ML ophthalmic solution 1 drop. Place 1 drop into both eyes 2 times daily.      Marland Kitchen esomeprazole (NEXIUM) 40 MG capsule 40 mg. Take 40 mg by mouth daily.      . flecainide (TAMBOCOR) 50 MG tablet take one by mouth twice daily  60 tablet  3  . labetalol (NORMODYNE) 200 MG tablet 200 mg. Take 200 mg by mouth 2 times daily.      . Lactase (LACTAID PO) Take by mouth as needed.      . methylcellulose (ARTIFICIAL TEARS) 1 % ophthalmic solution 1 drop. Place 1 drop into both eyes as needed.      . Multiple Vitamins-Minerals (OCUVITE-LUTEIN PO) 2 capsules. Take 2 capsules by mouth daily.      . prednisoLONE acetate (PRED FORTE) 1 %  ophthalmic suspension Place 1 drop into the right eye daily.      . sodium chloride (V-R NASAL SPRAY SALINE) 0.65 % nasal spray 1 spray. 1 spray by Nasal route as needed.       No current facility-administered medications for this visit.    Physical Exam: Filed Vitals:   12/26/12 1146  BP: 148/98  Pulse: 69  Height: 5\' 3"  (1.6 m)  Weight: 117 lb (53.071 kg)    GEN- The patient is well appearing, alert and oriented x 3 today.   Head- normocephalic, atraumatic Eyes-  Sclera clear, conjunctiva pink Ears- hearing intact Oropharynx- clear Lungs- Clear to ausculation bilaterally, normal work of breathing Heart- Regular rate and rhythm, no murmurs, rubs or gallops, PMI not laterally displaced GI- soft, NT, ND, + BS Extremities- no clubbing, cyanosis, or edema  ekg today reveals sinus rhythm 69 bpm, PR 184, QRS 112, QTc 477, LHAB  Assessment and Plan:  1. afib Stable No change required today  2. HTN Given occasional low BP, I am reluctant to more aggressively control BP at this time Continue current medicine regimen  She wishes to establish with a cardiologist in [facility] where she lives and is  actively making plans to do so.  Per her request, I will therefore see her as needed going forward.  She will contact my office if I can assist further in the future.

## 2013-01-08 DIAGNOSIS — Z961 Presence of intraocular lens: Secondary | ICD-10-CM | POA: Insufficient documentation

## 2013-01-14 ENCOUNTER — Telehealth: Payer: Self-pay | Admitting: Internal Medicine

## 2013-01-14 NOTE — Telephone Encounter (Signed)
New problem  Pt's sister wants to speak with you regarding Eily's BP.  Sister said it has been low for the past few mornings and she has been in afib.

## 2013-01-14 NOTE — Telephone Encounter (Signed)
She is going to change the times around on the medications to see if this helps to control the drops in her BP in the morning.  When taking the Cardizem and the Labetalol together they feel this may be what is causes her morning BP to drop. She is going to take her Cardizem at 2pm and see if this makes a difference

## 2013-01-17 ENCOUNTER — Telehealth: Payer: Self-pay | Admitting: Internal Medicine

## 2013-01-17 ENCOUNTER — Ambulatory Visit: Payer: Self-pay | Admitting: Neurology

## 2013-01-17 NOTE — Telephone Encounter (Signed)
Spoke with patient's sister she is going continue to hold her BP med due to her BP being so low.  With no meds her BP is 107/50 She will continue do this and watch her over the weekend.  If she starts to feel bad or have symptoms then she will go to the ER.  I told her she may need to see whom ever it is that follows her hemoglobin.  She feels the same but that MD iss in [facility]

## 2013-01-17 NOTE — Telephone Encounter (Signed)
New Prob     Would like to speak to nurse regarding pts BP. Please call.

## 2013-01-28 ENCOUNTER — Telehealth: Payer: Self-pay | Admitting: Internal Medicine

## 2013-01-28 NOTE — Telephone Encounter (Signed)
BP 95/72 this morning and they have made an appointment with Dr Johney Frame.  She is feeling some better with her stomach and her back is still aching some like a muscle ache.  She is going to keep her appointment scheduled.  She is taking the Labetalol at 1 pm and taking the Cardizem around midday and then the Labetalol again at night.  Seems to be a little bit better.  Does c/o of being tierd and now just having crazy BP readings.  Down in the mornings and comes back to a regular reading at night.

## 2013-01-28 NOTE — Telephone Encounter (Signed)
New problem  Pt wants to speak to you regarding an upcoming appt and pt BP.

## 2013-02-01 ENCOUNTER — Ambulatory Visit: Payer: Self-pay | Admitting: Nurse Practitioner

## 2013-02-06 ENCOUNTER — Other Ambulatory Visit: Payer: Self-pay | Admitting: Internal Medicine

## 2013-02-08 ENCOUNTER — Other Ambulatory Visit: Payer: Self-pay | Admitting: Internal Medicine

## 2013-02-20 ENCOUNTER — Other Ambulatory Visit: Payer: Self-pay

## 2013-02-28 ENCOUNTER — Ambulatory Visit (INDEPENDENT_AMBULATORY_CARE_PROVIDER_SITE_OTHER): Payer: Medicare Other | Admitting: Nurse Practitioner

## 2013-02-28 ENCOUNTER — Encounter: Payer: Self-pay | Admitting: Internal Medicine

## 2013-02-28 ENCOUNTER — Encounter: Payer: Self-pay | Admitting: Nurse Practitioner

## 2013-02-28 ENCOUNTER — Ambulatory Visit (INDEPENDENT_AMBULATORY_CARE_PROVIDER_SITE_OTHER): Payer: Medicare Other | Admitting: Internal Medicine

## 2013-02-28 VITALS — BP 185/104 | HR 77 | Temp 97.1°F | Ht 63.0 in | Wt 113.0 lb

## 2013-02-28 VITALS — BP 126/80 | HR 66 | Ht 63.5 in | Wt 113.0 lb

## 2013-02-28 DIAGNOSIS — I480 Paroxysmal atrial fibrillation: Secondary | ICD-10-CM

## 2013-02-28 DIAGNOSIS — I4891 Unspecified atrial fibrillation: Secondary | ICD-10-CM

## 2013-02-28 DIAGNOSIS — I779 Disorder of arteries and arterioles, unspecified: Secondary | ICD-10-CM

## 2013-02-28 DIAGNOSIS — G459 Transient cerebral ischemic attack, unspecified: Secondary | ICD-10-CM

## 2013-02-28 DIAGNOSIS — I10 Essential (primary) hypertension: Secondary | ICD-10-CM

## 2013-02-28 LAB — BASIC METABOLIC PANEL
BUN: 37 mg/dL — ABNORMAL HIGH (ref 6–23)
CO2: 19 mEq/L (ref 19–32)
Calcium: 9 mg/dL (ref 8.4–10.5)
Glucose, Bld: 85 mg/dL (ref 70–99)
Potassium: 4.5 mEq/L (ref 3.5–5.1)
Sodium: 139 mEq/L (ref 135–145)

## 2013-02-28 LAB — CBC WITH DIFFERENTIAL/PLATELET
Basophils Relative: 0.3 % (ref 0.0–3.0)
Eosinophils Relative: 4.9 % (ref 0.0–5.0)
HCT: 28.8 % — ABNORMAL LOW (ref 36.0–46.0)
Lymphs Abs: 1 10*3/uL (ref 0.7–4.0)
Monocytes Relative: 8.2 % (ref 3.0–12.0)
Platelets: 248 10*3/uL (ref 150.0–400.0)
RBC: 3.11 Mil/uL — ABNORMAL LOW (ref 3.87–5.11)
WBC: 7.5 10*3/uL (ref 4.5–10.5)

## 2013-02-28 NOTE — Progress Notes (Signed)
Dana Carr is a 72 y.o. female who presents today for routine cardiology followup.  Since her hospital discharge, the patient reports doing reasonably well.  She has had frequent hypotension recently.  Her afib has been well controlled.   Today, she denies symptoms of palpitations, chest pain, shortness of breath,  lower extremity edema, dizziness, presyncope, or syncope.  The patient is otherwise without complaint today.   Past Medical History  Diagnosis Date  . Hypertension   . Cervical cancer   . Breast cancer   . Peripheral vascular disease   . Renal artery stenosis   . Chronic renal insufficiency    Past Surgical History  Procedure Laterality Date  . Repair of perforated ulcer N/A     ulceration * 2 of the esophagus  . Thromboendarterectomy    . 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 Eye surgery Right cataract repair    Current Outpatient Prescriptions  Medication Sig Dispense Refill  . acetaminophen (TYLENOL) 500 MG tablet 500 mg. Take 500 mg by mouth as needed.      . clorazepate (TRANXENE) 7.5 MG tablet 7.5 mg. Take 7.5 mg by mouth 3 (three) times daily as needed.      . diltiazem (CARDIZEM CD) 120 MG 24 hr capsule take 1 capsule by mouth once daily  30 capsule  6  . dorzolamide-timolol (COSOPT) 22.3-6.8 MG/ML ophthalmic solution 1 drop. Place 1 drop into both eyes 2 times daily.      Marland Kitchen ELIQUIS 2.5 MG TABS tablet take 1 tablet by mouth twice a day  60 tablet  5  . esomeprazole (NEXIUM) 40 MG capsule 40 mg. Take 40 mg by mouth daily.      . flecainide (TAMBOCOR) 50 MG tablet take one by mouth twice daily  60 tablet  3  . labetalol (NORMODYNE) 200 MG tablet 200 mg. Take 200 mg by mouth 2 times daily.      . Lactase (LACTAID PO) Take by mouth as needed.      . methylcellulose (ARTIFICIAL TEARS) 1 % ophthalmic solution 1 drop. Place 1 drop into both eyes as needed.      . Multiple Vitamins-Minerals (OCUVITE-LUTEIN PO) 2 capsules. Take 2 capsules by mouth daily.       . prednisoLONE acetate (PRED FORTE) 1 % ophthalmic suspension Place 1 drop into the right eye daily.      . sodium chloride (V-R NASAL SPRAY SALINE) 0.65 % nasal spray 1 spray. 1 spray by Nasal route as needed.       No current facility-administered medications for this visit.    Physical Exam: Filed Vitals:   02/28/13 1145  BP: 126/80  Pulse: 66  Height: 5' 3.5" (1.613 m)  Weight: 113 lb (51.256 kg)    GEN- The patient is well appearing, alert and oriented x 3 today.   Head- normocephalic, atraumatic Eyes-  Sclera clear, conjunctiva pink Ears- hearing intact Oropharynx- clear Lungs- Clear to ausculation bilaterally, normal work of breathing Heart- Regular rate and rhythm, no murmurs, rubs or gallops, PMI not laterally displaced GI- soft, NT, ND, + BS Extremities- no clubbing, cyanosis, or edema  ekg today reveals sinus rhythm 69 bpm, PR 184, QRS 112, QTc 477, LHAB  Assessment and Plan:  1. afib Stable No change required today Check cbc and bmet on eliquis Refer to anticoagulation clinic  2. HTN Continue cardizem Stop scheduled labetalol and take labetolol 100mg  BID prn SBP >150 or DBP>90 bmet today  Return to see Lawson Fiscal in 4 weeks I will see in 4 months

## 2013-02-28 NOTE — Addendum Note (Signed)
Addended by: Dennis Bast F on: 02/28/2013 12:16 PM   Modules accepted: Orders

## 2013-02-28 NOTE — Patient Instructions (Signed)
Continue Eloquis  for secondary stroke prevention and maintain strict control of hypertension with blood pressure goal below 130/90, diabetes with hemoglobin A1c goal below 6.5% and lipids with LDL cholesterol goal below 100 mg/dL. Followup in the future with me in 3 months.  STROKE/TIA INSTRUCTIONS SMOKING Cigarette smoking nearly doubles your risk of having a stroke & is the single most alterable risk factor  If you smoke or have smoked in the last 12 months, you are advised to quit smoking for your health.  Most of the excess cardiovascular risk related to smoking disappears within a year of stopping.  Ask you doctor about anti-smoking medications  [employer] Quit Line: 1-800-QUIT NOW  Free Smoking Cessation Classes ([phone]  CHOLESTEROL Know your levels; limit fat & cholesterol in your diet  Lab Results  Component Value Date   CHOL 213* 11/11/2012   HDL 48 11/11/2012   LDLCALC 134* 11/11/2012   TRIG 154* 11/11/2012   CHOLHDL 4.4 11/11/2012      Many patients benefit from treatment even if their cholesterol is at goal.  Goal: Total Cholesterol less than 160  Goal:  LDL less than 100  Goal:  HDL greater than 40  Goal:  Triglycerides less than 150  BLOOD PRESSURE American Stroke Association blood pressure target is less that 120/80 mm/Hg  Your discharge blood pressure is:  BP: 185/104 mmHg  Monitor your blood pressure  Limit your salt and alcohol intake  Many individuals will require more than one medication for high blood pressure  DIABETES (A1c is a blood sugar average for last 3 months) Goal A1c is under 7% (A1c is blood sugar average for last 3 months)  Diabetes: No known diagnosis of diabetes    Lab Results  Component Value Date   HGBA1C 5.7* 11/11/2012    Your A1c can be lowered with medications, healthy diet, and exercise.  Check your blood sugar as directed by your physician  Call your physician if you experience unexplained or low blood sugars.  PHYSICAL  ACTIVITY/REHABILITATION Goal is 30 minutes at least 4 days per week    Activity decreases your risk of heart attack and stroke and makes your heart stronger.  It helps control your weight and blood pressure; helps you relax and can improve your mood.  Participate in a regular exercise program.  Talk with your doctor about the best form of exercise for you (dancing, walking, swimming, cycling).  DIET/WEIGHT Goal is to maintain a healthy weight  Your height is:  Height: 5\' 3"  (160 cm) Your current weight is: Weight: 113 lb (51.256 kg) Your body Mass Index (BMI) is:  BMI (Calculated): 20.1  Following the type of diet specifically designed for you will help prevent another stroke.  Your goal Body Mass Index (BMI) is 19-24.  Healthy food habits can help reduce 3 risk factors for stroke:  High cholesterol, hypertension, and excess weight.

## 2013-02-28 NOTE — Patient Instructions (Signed)
Your physician recommends that you schedule a follow-up appointment in: 4 weeks with Sunday Spillers and 4 months with Dr Johney Frame  Your physician has recommended you make the following change in your medication:  1) only take Labetalol as need for systolic BP greater than 150 and diastolic greater than 100

## 2013-02-28 NOTE — Progress Notes (Signed)
[facility]  PATIENT: Dana Carr DOB: [birth year]   HISTORY FROM: patient, chart REASON FOR VISIT: routine follow up  HISTORY OF PRESENT ILLNESS:  Dana Carr is a 72 y.o. female a history of severe hypertension who on 11/11/12 walked into the kitchen, called out her daughter's name and then became unresponsive. When EMS arrival, she was moving her left side but not her right and was nonverbal. En route, she began having some speech and began moving her right side well. On arrival to the ER, she remained aphasic.  In the field EMS observed afib, but she was in sinus rhythm in the ER. Her neuro deficits quickly resolved.  She was started on Eloquis and Flecainide.  UPDATE 02/28/13 (LL):  Patient comes to office for hospital follow up.  She has no neurovascular deficits.  She was seen by Dr. Johney Frame earlier today who did blood work.  She is managed on Eloquis and Flecainide for A fib.  She is tolerating Eloquis with no excessive bruising or bleeding.  She is very pale, anemic, reports having no energy.  She has had episodes of hypotension recently.  Due to recent increase in creatinine, she will have evaluation at Nephrology in 2 weeks.  Her home is in [place], [place]; she is staying at her sister's home presently.   REVIEW OF SYSTEMS: Full 14 system review of systems performed and notable only for: constitutional: N/A  cardiovascular: N/A respiratory: N/A endocrine: N/A  ear/nose/throat: N/A  Hematology/Lymph: easy bleeding, easy bruising musculoskeletal: N/A skin: N/A genitourinary: N/A Gastrointestinal: N/A allergy/immunology: allergies neurological: headache sleep: snoring, restless legs psychiatric: anxiety, decreased energy, change in appetite   ALLERGIES: Allergies  Allergen Reactions  . Tape Other (See Comments)    Skin irritation  . Vasotec [Enalapril] Other (See Comments)    Throat irritation-lost voice    HOME MEDICATIONS: Outpatient Prescriptions  Prior to Visit  Medication Sig Dispense Refill  . acetaminophen (TYLENOL) 500 MG tablet 500 mg. Take 500 mg by mouth as needed.      . clorazepate (TRANXENE) 7.5 MG tablet 7.5 mg. Take 7.5 mg by mouth 3 (three) times daily as needed.      . diltiazem (CARDIZEM CD) 120 MG 24 hr capsule take 1 capsule by mouth once daily  30 capsule  6  . dorzolamide-timolol (COSOPT) 22.3-6.8 MG/ML ophthalmic solution 1 drop. Place 1 drop into both eyes 2 times daily.      Marland Kitchen ELIQUIS 2.5 MG TABS tablet take 1 tablet by mouth twice a day  60 tablet  5  . esomeprazole (NEXIUM) 40 MG capsule 40 mg. Take 40 mg by mouth daily.      . flecainide (TAMBOCOR) 50 MG tablet take one by mouth twice daily  60 tablet  3  . labetalol (NORMODYNE) 200 MG tablet 200 mg. Take 200 mg by mouth 2 times daily.      . Lactase (LACTAID PO) Take by mouth as needed.      . methylcellulose (ARTIFICIAL TEARS) 1 % ophthalmic solution 1 drop. Place 1 drop into both eyes as needed.      . Multiple Vitamins-Minerals (OCUVITE-LUTEIN PO) 2 capsules. Take 2 capsules by mouth daily.      . prednisoLONE acetate (PRED FORTE) 1 % ophthalmic suspension Place 1 drop into the right eye daily.      . sodium chloride (V-R NASAL SPRAY SALINE) 0.65 % nasal spray 1 spray. 1 spray by Nasal route as needed.  No facility-administered medications prior to visit.    PAST MEDICAL HISTORY: Past Medical History  Diagnosis Date  . Hypertension   . Cervical cancer   . Breast cancer   . Peripheral vascular disease   . Renal artery stenosis   . Chronic renal insufficiency     PAST SURGICAL HISTORY: Past Surgical History  Procedure Laterality Date  . Repair of perforated ulcer N/A     ulceration * 2 of the esophagus  . Thromboendarterectomy    . 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 Eye surgery Right cataract repair    FAMILY HISTORY: Family History  Problem Relation Age of Onset  . Atrial fibrillation Brother     SOCIAL  HISTORY: History   Social History  . Marital Status: Single    Spouse Name: N/A    Number of Children: N/A  . Years of Education: N/A   Occupational History  . Not on file.   Social History Main Topics  . Smoking status: Former Smoker -- 0.25 packs/day for 40 years    Types: Cigarettes    Quit date: 12/31/2011  . Smokeless tobacco: Never Used  . Alcohol Use: No  . Drug Use: No  . Sexual Activity: Not Currently   Other Topics Concern  . Not on file   Social History Narrative   Lives in [place]     PHYSICAL EXAM  Filed Vitals:   02/28/13 1555  BP: 185/104  Pulse: 77  Temp: 97.1 F (36.2 C)  TempSrc: Oral  Height: 5\' 3"  (1.6 m)  Weight: 113 lb (51.256 kg)   Body mass index is 20.02 kg/(m^2).  Generalized: In no acute distress, very pale, frail appearing Caucasian female.  Neck: Supple, no carotid bruits   Cardiac: Regular rate rhythm, no murmur   Pulmonary: Clear to auscultation bilaterally   Musculoskeletal: No deformity   Neurological examination   Mentation: Alert oriented to time, place, history taking, language fluent, and casual conversation  Cranial nerve II-XII: Pupils were equal round reactive to light extraocular movements were full, visual field were full on confrontational test. facial sensation and strength were normal. hearing was intact to finger rubbing bilaterally. Uvula tongue midline. head turning and shoulder shrug and were normal and symmetric.Tongue protrusion into cheek strength was normal. MOTOR: normal bulk and tone, full strength in the BUE, BLE, fine finger movements normal, no pronator drift SENSORY: normal and symmetric to light touch, pinprick, temperature, vibration COORDINATION: finger-nose-finger, heel-to-shin bilaterally, there was no truncal ataxia REFLEXES: Brachioradialis 2/2, biceps 2/2, triceps 2/2, patellar 2/2, Achilles 2/2, plantar responses were flexor bilaterally. GAIT/STATION: Rising up from seated position  without assistance, normal stance, without trunk ataxia, moderate stride, good arm swing, smooth turning, able to perform tiptoe, and heel walking without difficulty.    DIAGNOSTIC DATA (LABS, IMAGING, TESTING) - I reviewed patient records, labs, notes, testing and imaging myself where available.  Lab Results  Component Value Date   WBC 7.5 02/28/2013   HGB 9.6* 02/28/2013   HCT 28.8* 02/28/2013   MCV 92.7 02/28/2013   PLT 248.0 02/28/2013      Component Value Date/Time   NA 139 02/28/2013 1222   K 4.5 02/28/2013 1222   CL 111 02/28/2013 1222   CO2 19 02/28/2013 1222   GLUCOSE 85 02/28/2013 1222   BUN 37* 02/28/2013 1222   CREATININE 2.4* 02/28/2013 1222   CALCIUM 9.0 02/28/2013 1222   PROT 6.8 11/11/2012 0047   ALBUMIN 3.6  11/11/2012 0047   AST 17 11/11/2012 0047   ALT 9 11/11/2012 0047   ALKPHOS 75 11/11/2012 0047   BILITOT 0.4 11/11/2012 0047   GFRNONAA 25* 11/14/2012 0520   GFRAA 28* 11/14/2012 0520   Lab Results  Component Value Date   CHOL 213* 11/11/2012   HDL 48 11/11/2012   LDLCALC 134* 11/11/2012   TRIG 154* 11/11/2012   CHOLHDL 4.4 11/11/2012   Lab Results  Component Value Date   HGBA1C 5.7* 11/11/2012   No results found for this basename: VITAMINB12   Lab Results  Component Value Date   TSH 0.918 11/12/2012   CT Head 11/11/2012 Focal hyperdensity within the right thalamus, less likely acute hemorrhage. Periventricular white matter hypodensity, which is age indeterminate but could be anoxic change related to hypotension given the provided clinical history.  MRI of the brain 11/11/2012 Negative for acute ischemic infarct. Moderate to advanced chronic microvascular ischemia. Small area of susceptibility in the right pons, favor chronic hemorrhage. This is hyperdense on CT and recent hemorrhage cannot be completely excluded. There is also a smaller chronic hemorrhage in the right pons.  MRA of the brain 11/11/2012 Occluded left internal carotid artery. There is good collateral  circulation in the circle of Willis with patent anterior middle cerebral arteries on the left. Tortuous intracranial vessels suggesting chronic hypertension.  2D Echocardiogram EF 50-55% with no source of embolus.  Carotid Doppler Right There appears to be a dissection in the most distal CCA / Bulb region. Left - ICA is occluded. Bilateral vertebral artery flow is antegrade.  CXR 11/11/2012 No acute cardiopulmonary disease.   ASSESSMENT AND PLAN Ms. Shaunte Tuft is a 72 y.o. female presenting with syncopal episode and transient right sided weakness in setting of atrial fibrillation with rapid ventricular response on 11/11/12.   Dx: left brain TIA.  Now on eliquis. Patient with no resultant neuro deficits. 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  Follow up with Dr. Johney Frame for blood pressure control and afib.  Continue Eloquis  for secondary stroke prevention and maintain strict control of hypertension with blood pressure goal below 130/90, diabetes with hemoglobin A1c goal below 6.5% and lipids with LDL cholesterol goal below 100 mg/dL. Followup in the future with me in 6 months.  Cassian Torelli NP-C 02/28/2013, 4:01 PM  [facility] [address], [address] [place], [place] [address] [phone]  I have personally examined this patient, reviewed pertinent data, developed plan of care and discussed with patient and agree with above.  Delia Heady, MD

## 2013-03-13 ENCOUNTER — Other Ambulatory Visit: Payer: Self-pay

## 2013-03-13 MED ORDER — FLECAINIDE ACETATE 50 MG PO TABS: 50.0000 mg | ORAL_TABLET | Freq: Two times a day (BID) | ORAL | Status: DC

## 2013-03-13 MED ORDER — FLECAINIDE ACETATE 50 MG PO TABS: 50.0000 mg | ORAL_TABLET | Freq: Two times a day (BID) | ORAL | Status: AC

## 2013-03-29 ENCOUNTER — Ambulatory Visit: Payer: Medicare Other | Admitting: Nurse Practitioner

## 2013-04-02 ENCOUNTER — Telehealth: Payer: Self-pay | Admitting: Internal Medicine

## 2013-04-02 NOTE — Telephone Encounter (Signed)
Spoke with her sister and let her know that she really needs to follow up with her PCP for right flank pain.  She has seen Dr. Marcelle Overlie a nephrologist is [facility] [place] and they are going to see her back in 3 months to follow up on kidney function

## 2013-04-02 NOTE — Telephone Encounter (Signed)
New problem   Pt's sister need you to call her concerning the pt.

## 2013-04-17 ENCOUNTER — Ambulatory Visit: Payer: Medicare Other | Admitting: Nurse Practitioner

## 2013-04-17 DEATH — deceased

## 2013-05-23 ENCOUNTER — Other Ambulatory Visit: Payer: Self-pay

## 2013-07-01 ENCOUNTER — Ambulatory Visit: Payer: Medicare Other | Admitting: Internal Medicine

## 2013-08-29 ENCOUNTER — Ambulatory Visit: Payer: Self-pay | Admitting: Nurse Practitioner

## 2013-08-29 ENCOUNTER — Telehealth: Payer: Self-pay | Admitting: Nurse Practitioner

## 2013-08-29 NOTE — Telephone Encounter (Signed)
Patient was no-show for appointment today 

## 2013-09-21 ENCOUNTER — Encounter: Payer: Self-pay | Admitting: *Deleted

## 2014-10-23 IMAGING — CR DG CHEST 1V
1 series · 1 of 1 positions shown · non-contrast
Comparison: None.

CLINICAL DATA: Stroke

CHEST - 1 VIEW

[view not recorded]
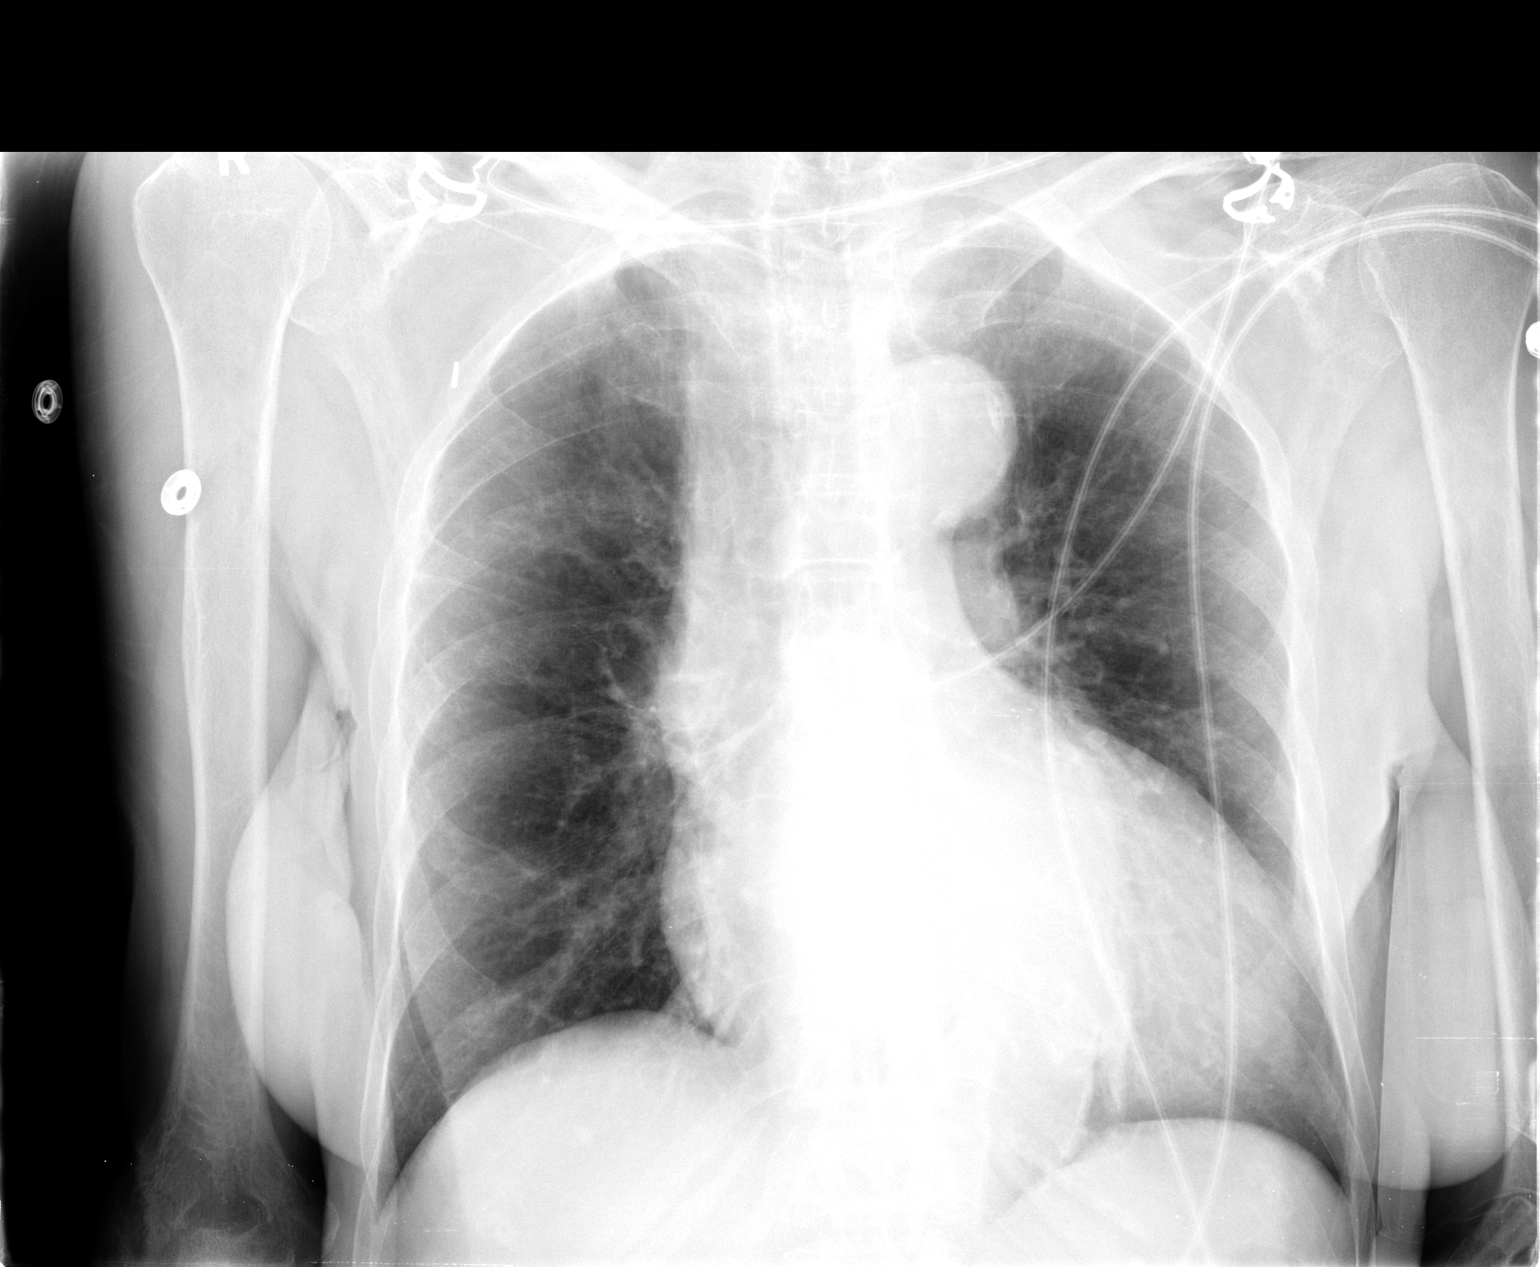

[1 of 1 positions shown; findings below may reference images not displayed]

FINDINGS: The cardiac silhouette is mildly enlarged.  The aorta is
tortuous.  No mediastinal or hilar masses.  The lungs are clear.
The bony thorax is diffusely demineralized but intact.
IMPRESSION: No acute cardiopulmonary disease.
